# Patient Record
Sex: Female | Born: 1975 | Hispanic: Yes | Smoking: Former smoker
Health system: Southern US, Community
[De-identification: ages and names within clinical notes are randomized; demographics above are authoritative.]

## PROBLEM LIST (undated history)

## (undated) DIAGNOSIS — R011 Cardiac murmur, unspecified: Secondary | ICD-10-CM

## (undated) DIAGNOSIS — F4310 Post-traumatic stress disorder, unspecified: Secondary | ICD-10-CM

## (undated) DIAGNOSIS — F419 Anxiety disorder, unspecified: Secondary | ICD-10-CM

## (undated) DIAGNOSIS — F32A Depression, unspecified: Secondary | ICD-10-CM

## (undated) DIAGNOSIS — L409 Psoriasis, unspecified: Secondary | ICD-10-CM

## (undated) DIAGNOSIS — R7989 Other specified abnormal findings of blood chemistry: Secondary | ICD-10-CM

## (undated) HISTORY — PX: TUBAL LIGATION: SHX77

## (undated) HISTORY — PX: ENDOMETRIAL ABLATION: SHX621

---

## 2019-09-19 ENCOUNTER — Ambulatory Visit (HOSPITAL_COMMUNITY)
Admission: EM | Admit: 2019-09-19 | Discharge: 2019-09-19 | Disposition: A | Discharge status: Home / Self Care | Payer: Self-pay | Attending: Urgent Care | Admitting: Urgent Care

## 2019-09-19 ENCOUNTER — Other Ambulatory Visit: Payer: Self-pay

## 2019-09-19 ENCOUNTER — Encounter (HOSPITAL_COMMUNITY): Payer: Self-pay

## 2019-09-19 DIAGNOSIS — R0602 Shortness of breath: Secondary | ICD-10-CM

## 2019-09-19 DIAGNOSIS — R5381 Other malaise: Secondary | ICD-10-CM

## 2019-09-19 DIAGNOSIS — R0789 Other chest pain: Secondary | ICD-10-CM

## 2019-09-19 DIAGNOSIS — J029 Acute pharyngitis, unspecified: Secondary | ICD-10-CM

## 2019-09-19 DIAGNOSIS — B349 Viral infection, unspecified: Secondary | ICD-10-CM

## 2019-09-19 HISTORY — DX: Cardiac murmur, unspecified: R01.1

## 2019-09-19 HISTORY — DX: Psoriasis, unspecified: L40.9

## 2019-09-19 MED ORDER — CETIRIZINE HCL 10 MG PO TABS: 10.0000 mg | ORAL_TABLET | Freq: Every day | ORAL | 0 refills | Status: DC

## 2019-09-19 MED ORDER — PSEUDOEPHEDRINE HCL 60 MG PO TABS: 60.0000 mg | ORAL_TABLET | Freq: Three times a day (TID) | ORAL | 0 refills | Status: DC | PRN

## 2019-09-19 MED ORDER — NAPROXEN 500 MG PO TABS: 500.0000 mg | ORAL_TABLET | Freq: Two times a day (BID) | ORAL | 0 refills | Status: DC

## 2019-09-19 NOTE — Discharge Instructions (Addendum)

## 2019-09-19 NOTE — ED Triage Notes (Signed)
Patient presents to Urgent Care with complaints of sore throat since the past few days. Patient reports she took ibuprofen this morning at 0800, pt thinks her throat is sore from having to double-mask at work.

## 2019-09-19 NOTE — ED Provider Notes (Signed)
[facility]   MRN: [id number] DOB: 13-Jun-1976  Subjective:   Kelsey Lee is a 44 y.o. female presenting for 2 to 3-day history of acute onset malaise including persistent and worsening throat pain.  States that last night she had a transient episode of chest pain and shortness of breath.  This is no longer present today but is concerning to the patient.  She did take ibuprofen with some relief.  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 Denies direct COVID 19 contacts/exposure.  Denies smoking history, history of asthma or COPD, history of allergies.  She did have postnasal drainage last night.  Felt like symptoms were worse overnight.   Denies taking chronic medications.  No Known Allergies  Past Medical History:  Diagnosis Date  . Heart murmur   . Psoriasis      Past Surgical History:  Procedure Laterality Date  . CESAREAN SECTION    . ENDOMETRIAL ABLATION    . TUBAL LIGATION      Family History  Problem Relation Age of Onset  . Diabetes Mother   . Hypertension Mother   . Cancer Father   . Diabetes Father   . Hypertension Father     Social History   Tobacco Use  . Smoking status: Current Every Day Smoker    Types: Cigarettes  . Smokeless tobacco: Never Used  . Tobacco comment: 2 cigs per day  Substance Use Topics  . Alcohol use: Not Currently  . Drug use: Not on file    Review of Systems  Constitutional: Negative for fever and malaise/fatigue.  HENT: Positive for sore throat. Negative for congestion, ear pain and sinus pain.   Eyes: Negative for blurred vision, double vision, discharge and redness.  Respiratory: Positive for shortness of breath. Negative for cough, hemoptysis and wheezing.   Cardiovascular: Positive for chest pain.  Gastrointestinal: Negative for abdominal pain, diarrhea, nausea and vomiting.  Genitourinary: Negative for dysuria, flank pain and hematuria.  Musculoskeletal: Negative  for myalgias.  Skin: Negative for rash.  Neurological: Negative for dizziness, weakness and headaches.  Psychiatric/Behavioral: Negative for depression and substance abuse.     Objective:   Vitals: BP (!) 107/58 (BP Location: Left Arm)   Pulse 85   Temp 98.4 F (36.9 C) (Oral)   Resp 16   SpO2 99%   Physical Exam Constitutional:      General: She is not in acute distress.    Appearance: Normal appearance. She is well-developed. She is not ill-appearing, toxic-appearing or diaphoretic.  HENT:     Head: Normocephalic and atraumatic.     Right Ear: Tympanic membrane and ear canal normal. No drainage or tenderness. No middle ear effusion. Tympanic membrane is not erythematous.     Left Ear: Tympanic membrane and ear canal normal. No drainage or tenderness.  No middle ear effusion. Tympanic membrane is not erythematous.     Nose: Nose normal. No congestion or rhinorrhea.     Mouth/Throat:     Mouth: Mucous membranes are moist. No oral lesions.     Pharynx: No pharyngeal swelling, oropharyngeal exudate, posterior oropharyngeal erythema or uvula swelling.     Tonsils: No tonsillar exudate or tonsillar abscesses.     Comments: Post-nasal drainage overlying pharynx. Eyes:     Extraocular Movements: Extraocular movements intact.     Right eye: Normal extraocular motion.     Left eye: Normal extraocular motion.     Conjunctiva/sclera: Conjunctivae normal.  Pupils: Pupils are equal, round, and reactive to light.  Cardiovascular:     Rate and Rhythm: Normal rate and regular rhythm.     Pulses: Normal pulses.     Heart sounds: Normal heart sounds. No murmur. No friction rub. No gallop.   Pulmonary:     Effort: Pulmonary effort is normal. No respiratory distress.     Breath sounds: Normal breath sounds. No stridor. No wheezing, rhonchi or rales.  Musculoskeletal:     Cervical back: Normal range of motion and neck supple.  Lymphadenopathy:     Cervical: No cervical adenopathy.    Skin:    General: Skin is warm and dry.     Findings: No rash.  Neurological:     General: No focal deficit present.     Mental Status: She is alert and oriented to person, place, and time.  Psychiatric:        Mood and Affect: Mood normal.        Behavior: Behavior normal.        Thought Content: Thought content normal.      Assessment and Plan :   1. Viral illness   2. Sore throat   3. Malaise   4. Atypical chest pain   5. Shortness of breath     Patient refused COVID-19 testing, strep testing and testing in general due to cost burden.  Counseled that she is likely undergoing a viral illness and recommended supportive care.  Offered her chest x-ray, EKG to further evaluate her episode of chest pain and shortness of breath last night.  She declined this due to cost burden and lack of medical history for risk factors.  She will pursue COVID-19 testing to her employer. Counseled patient on potential for adverse effects with medications prescribed/recommended today, ER and return-to-clinic precautions discussed, patient verbalized understanding.    Jaynee Eagles, PA-C 09/19/19 1253

## 2020-04-04 ENCOUNTER — Encounter (HOSPITAL_COMMUNITY): Payer: Self-pay

## 2020-04-04 ENCOUNTER — Ambulatory Visit (HOSPITAL_COMMUNITY)
Admission: EM | Admit: 2020-04-04 | Discharge: 2020-04-04 | Disposition: A | Discharge status: Home / Self Care | Payer: 59 | Attending: Family Medicine | Admitting: Family Medicine

## 2020-04-04 ENCOUNTER — Other Ambulatory Visit: Payer: Self-pay

## 2020-04-04 DIAGNOSIS — I959 Hypotension, unspecified: Secondary | ICD-10-CM | POA: Insufficient documentation

## 2020-04-04 DIAGNOSIS — R0789 Other chest pain: Secondary | ICD-10-CM | POA: Insufficient documentation

## 2020-04-04 LAB — COMPREHENSIVE METABOLIC PANEL
ALT: 20 U/L (ref 0–44)
AST: 22 U/L (ref 15–41)
Albumin: 4 g/dL (ref 3.5–5.0)
Alkaline Phosphatase: 61 U/L (ref 38–126)
Anion gap: 9 (ref 5–15)
BUN: 8 mg/dL (ref 6–20)
CO2: 24 mmol/L (ref 22–32)
Calcium: 9.1 mg/dL (ref 8.9–10.3)
Chloride: 103 mmol/L (ref 98–111)
Creatinine, Ser: 0.53 mg/dL (ref 0.44–1.00)
GFR calc Af Amer: >60 mL/min (ref >60)
GFR calc non Af Amer: >60 mL/min (ref >60)
Glucose, Bld: 96 mg/dL (ref 70–99)
Potassium: 4.2 mmol/L (ref 3.5–5.1)
Sodium: 136 mmol/L (ref 135–145)
Total Bilirubin: 0.5 mg/dL (ref 0.3–1.2)
Total Protein: 7 g/dL (ref 6.5–8.1)

## 2020-04-04 LAB — CBC WITH DIFFERENTIAL/PLATELET
Abs Immature Granulocytes: 0.02 10*3/uL (ref 0.00–0.07)
Basophils Absolute: 0.1 10*3/uL (ref 0.0–0.1)
Basophils Relative: 1 %
Eosinophils Absolute: 0.1 10*3/uL (ref 0.0–0.5)
Eosinophils Relative: 2 %
HCT: 42.7 % (ref 36.0–46.0)
Hemoglobin: 14.4 g/dL (ref 12.0–15.0)
Immature Granulocytes: 0 %
Lymphocytes Relative: 29 %
Lymphs Abs: 2 10*3/uL (ref 0.7–4.0)
MCH: 30.6 pg (ref 26.0–34.0)
MCHC: 33.7 g/dL (ref 30.0–36.0)
MCV: 90.7 fL (ref 80.0–100.0)
Monocytes Absolute: 0.5 10*3/uL (ref 0.1–1.0)
Monocytes Relative: 7 %
Neutro Abs: 4.2 10*3/uL (ref 1.7–7.7)
Neutrophils Relative %: 61 %
Platelets: 251 10*3/uL (ref 150–400)
RBC: 4.71 MIL/uL (ref 3.87–5.11)
RDW: 13.6 % (ref 11.5–15.5)
WBC: 7 10*3/uL (ref 4.0–10.5)
nRBC: 0 % (ref 0.0–0.2)
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
## 2020-04-04 NOTE — Discharge Instructions (Addendum)
It was wonderful to meet you. It is very important that you call cardiology for further evaluation. Please make sure that you are drinking plenty of water and staying hydrated. Continue to do breathing exercises and work with therapy to help control your anxiety. You should hear back from your lab results in the next few days.  If you have a recurrence of chest pain, feeling lightheaded/dizzy, shortness of breath, or any passing out episode please be seen right away.

## 2020-04-04 NOTE — ED Triage Notes (Signed)
Pt c/o "low blood pressure" yesterday and today. Pt states yesterday her BP was 81/40s and she was experiencing "stabbing" CP to left sternal area and radiating to left back scapular/shoulder area. Pt also reports dizziness/lightheadedness and "sweaty/clammy palms" at time. Pt took low dose ASA at home yesterday and slept   Pt describes current CP as "dull like a bruise" 4/10. Reports fatigue.   Denies SOB, diaphoresis, extremity weakness, slurred speech, change in vision. Pt verbalizes "tender" when left chest area palpated.  EKG performed and results given to T. Bast and Dr. Delton See, who advised that pt can be triaged and return to waiting room for further eval according to time of arrival.

## 2020-05-29 ENCOUNTER — Telehealth: Payer: HRSA Program | Admitting: Nurse Practitioner

## 2020-05-29 DIAGNOSIS — U071 COVID-19: Secondary | ICD-10-CM | POA: Diagnosis not present

## 2020-05-29 MED ORDER — BENZONATATE 100 MG PO CAPS: 100.0000 mg | ORAL_CAPSULE | Freq: Three times a day (TID) | ORAL | 0 refills | Status: DC | PRN

## 2020-05-29 NOTE — Progress Notes (Signed)
E-Visit for Corona Virus Screening  Your current symptoms could be consistent with the coronavirus.  Many health care providers can now test patients at their office but not all are.  [facility] has multiple testing sites. For information on our COVID testing locations and hours go to [email or web address]  We are enrolling you in our MyChart Home Monitoring for COVID19 . Daily you will receive a questionnaire within the MyChart website. Our COVID 19 response team will be monitoring your responses daily.  Testing Information: The COVID-19 Community Testing sites are testing BY APPOINTMENT ONLY.  You can schedule online at [email or web address]  If you do not have access to a smart phone or computer you may call [phone] for an appointment.   Additional testing sites in the Community:  . For [facility] Testing sites in [place]  https://www.[facility]vs.com/minuteclinic/covid-19-testing  . For Pop[facility] testing sites in [place]  https://covid19.ncd[facility]hs.gov/about-covid-19/testing/find-my-testing-place/pop-testing-sites  . For [facility] https://[place].gov[place]/human-services/health-department/coronavirus-covid-19-info/covid-19-testing  . For [place] testing in [place] https://www.[place].gov[place]/human-services/health-department/coronavirus-covid-19-info/covid-19-testing  . For [facility] testing in [place]   https://[facility].care/covidtesting  For  more information about community testing call [phone]   Please quarantine yourself while awaiting your test results. Please stay home for a minimum of 10 days from the first day of illness with improving symptoms and you have had 24 hours of no fever (without the use of Tylenol (Acetaminophen) Motrin (Ibuprofen) or any fever reducing medication).  Also - Do not get tested prior to returning to work because once you have had a positive test the test can stay  positive for more than a month in some cases.   You should wear a mask or cloth face covering over your nose and mouth if you must be around other people or animals, including pets (even at home). Try to stay at least 6 feet away from other people. This will protect the people around you.  Please continue good preventive care measures, including:  frequent hand-washing, avoid touching your face, cover coughs/sneezes, stay out of crowds and keep a 6 foot distance from others.  COVID-19 is a respiratory illness with symptoms that are similar to the flu. Symptoms are typically mild to moderate, but there have been cases of severe illness and death due to the virus.   The following symptoms may appear 2-14 days after exposure: . Fever . Cough . Shortness of breath or difficulty breathing . Chills . Repeated shaking with chills . Muscle pain . Headache . Sore throat . New loss of taste or smell . Fatigue . Congestion or runny nose . Nausea or vomiting . Diarrhea  Go to the nearest hospital ED for assessment if fever/cough/breathlessness are severe or illness seems like a threat to life.  It is vitally important that if you feel that you have an infection such as this virus or any other virus that you stay home and away from places where you may spread it to others.  You should avoid contact with people age 65 and older.   You can use medication such as A prescription cough medication called Tessalon Perles 100 mg. You may take 1-2 capsules every 8 hours as needed for cough  You may also take acetaminophen (Tylenol) as needed for fever.  Reduce your risk of any infection by using the same precautions used for avoiding the common cold or flu:  . Wash your hands often with soap and warm water for at least 20 seconds.  If soap and water are not   readily available, use an alcohol-based hand sanitizer with at least 60% alcohol.  . If coughing or sneezing, cover your mouth and nose by coughing or  sneezing into the elbow areas of your shirt or coat, into a tissue or into your sleeve (not your hands). . Avoid shaking hands with others and consider head nods or verbal greetings only. . Avoid touching your eyes, nose, or mouth with unwashed hands.  . Avoid close contact with people who are sick. . Avoid places or events with large numbers of people in one location, like concerts or sporting events. . Carefully consider travel plans you have or are making. . If you are planning any travel outside or inside the [place], visit the CDC's Travelers' Health webpage for the latest health notices. . If you have some symptoms but not all symptoms, continue to monitor at home and seek medical attention if your symptoms worsen. . If you are having a medical emergency, call 911.  HOME CARE . Only take medications as instructed by your medical team. . Drink plenty of fluids and get plenty of rest. . A steam or ultrasonic humidifier can help if you have congestion.   GET HELP RIGHT AWAY IF YOU HAVE EMERGENCY WARNING SIGNS** FOR COVID-19. If you or someone is showing any of these signs seek emergency medical care immediately. Call 911 or proceed to your closest emergency facility if: . You develop worsening high fever. . Trouble breathing . Bluish lips or face . Persistent pain or pressure in the chest . New confusion . Inability to wake or stay awake . You cough up blood. . Your symptoms become more severe  **This list is not all possible symptoms. Contact your medical provider for any symptoms that are sever or concerning to you.  MAKE SURE YOU   Understand these instructions.  Will watch your condition.  Will get help right away if you are not doing well or get worse.  Your e-visit answers were reviewed by a board certified advanced clinical practitioner to complete your personal care plan.  Depending on the condition, your plan could have included both over the counter or prescription  medications.  If there is a problem please reply once you have received a response from your provider.  Your safety is important to us.  If you have drug allergies check your prescription carefully.    You can use MyChart to ask questions about today's visit, request a non-urgent call back, or ask for a work or school excuse for 24 hours related to this e-Visit. If it has been greater than 24 hours you will need to follow up with your provider, or enter a new e-Visit to address those concerns. You will get an e-mail in the next two days asking about your experience.  I hope that your e-visit has been valuable and will speed your recovery. Thank you for using e-visits.  5-10 minutes spent reviewing and documenting in chart.   

## 2020-06-27 DIAGNOSIS — Z1231 Encounter for screening mammogram for malignant neoplasm of breast: Secondary | ICD-10-CM

## 2020-07-25 ENCOUNTER — Ambulatory Visit: Payer: 59 | Admitting: Family Medicine

## 2020-08-19 ENCOUNTER — Telehealth: Payer: 59 | Admitting: Family

## 2020-08-19 DIAGNOSIS — R6889 Other general symptoms and signs: Secondary | ICD-10-CM

## 2020-08-19 MED ORDER — BENZONATATE 100 MG PO CAPS: 100.0000 mg | ORAL_CAPSULE | Freq: Three times a day (TID) | ORAL | 0 refills | Status: DC | PRN

## 2020-08-19 NOTE — Progress Notes (Signed)
E-Visit for Corona Virus Screening  Your current symptoms could be consistent with the coronavirus.  Many health care providers can now test patients at their office but not all are.  [facility] has multiple testing sites. For information on our COVID testing locations and hours go to [email or web address]  We are enrolling you in our MyChart Home Monitoring for COVID19 . Daily you will receive a questionnaire within the MyChart website. Our COVID 19 response team will be monitoring your responses daily.  Testing Information: The COVID-19 Community Testing sites are testing BY APPOINTMENT ONLY.  You can schedule online at [email or web address]  If you do not have access to a smart phone or computer you may call [phone] for an appointment.   Additional testing sites in the Community:  . For [facility] Testing sites in [facility]  [email or web address]  . For Pop-up testing sites in [place]  [email or web address]  . For [facility] [email or web address]  . For [facility] testing in [place] and [employer] [email or web address]  . For [facility] testing in [facility]   [email or web address]  For  more information about community testing call [phone]   Please quarantine yourself while awaiting your test results. Please stay home for a minimum of 10 days from the first day of illness with improving symptoms and you have had 24 hours of no fever (without the use of Tylenol (Acetaminophen) Motrin (Ibuprofen) or any fever reducing medication).  Also - Do not get tested prior to returning to work because once you have had a positive test the test can stay  positive for more than a month in some cases.   You should wear a mask or cloth face covering over your nose and mouth if you must be around other people or animals, including pets (even at home). Try to stay at least 6 feet away from other people. This will protect the people around you.  Please continue good preventive care measures, including:  frequent hand-washing, avoid touching your face, cover coughs/sneezes, stay out of crowds and keep a 6 foot distance from others.  COVID-19 is a respiratory illness with symptoms that are similar to the flu. Symptoms are typically mild to moderate, but there have been cases of severe illness and death due to the virus.   The following symptoms may appear 2-14 days after exposure: . Fever . Cough . Shortness of breath or difficulty breathing . Chills . Repeated shaking with chills . Muscle pain . Headache . Sore throat . New loss of taste or smell . Fatigue . Congestion or runny nose . Nausea or vomiting . Diarrhea  Go to the nearest hospital ED for assessment if fever/cough/breathlessness are severe or illness seems like a threat to life.  It is vitally important that if you feel that you have an infection such as this virus or any other virus that you stay home and away from places where you may spread it to others.  You should avoid contact with people age 4 and older.   You can use medication such as A prescription cough medication called Tessalon Perles 100 mg. You may take 1-2 capsules every 8 hours as needed for cough.   I have sent a work note to [employer], It will be under letters.   You may also take acetaminophen (Tylenol) as needed for fever.  Reduce your risk of any infection by using the same precautions used for avoiding the common cold or flu:  Marland Kitchen Wash your hands often  with soap and warm water for at least 20 seconds.  If soap and water are not readily available, use an alcohol-based hand sanitizer with at least 60% alcohol.   . If coughing or sneezing, cover your mouth and nose by coughing or sneezing into the elbow areas of your shirt or coat, into a tissue or into your sleeve (not your hands). . Avoid shaking hands with others and consider head nods or verbal greetings only. . Avoid touching your eyes, nose, or mouth with unwashed hands.  . Avoid close contact with people who are sick. . Avoid places or events with large numbers of people in one location, like concerts or sporting events. . Carefully consider travel plans you have or are making. . If you are planning any travel outside or inside the [place], visit the CDC's Travelers' Health webpage for the latest health notices. . If you have some symptoms but not all symptoms, continue to monitor at home and seek medical attention if your symptoms worsen. . If you are having a medical emergency, call 911.  HOME CARE . Only take medications as instructed by your medical team. . Drink plenty of fluids and get plenty of rest. . A steam or ultrasonic humidifier can help if you have congestion.   GET HELP RIGHT AWAY IF YOU HAVE EMERGENCY WARNING SIGNS** FOR COVID-19. If you or someone is showing any of these signs seek emergency medical care immediately. Call 911 or proceed to your closest emergency facility if: . You develop worsening high fever. . Trouble breathing . Bluish lips or face . Persistent pain or pressure in the chest . New confusion . Inability to wake or stay awake . You cough up blood. . Your symptoms become more severe  **This list is not all possible symptoms. Contact your medical provider for any symptoms that are sever or concerning to you.  MAKE SURE YOU   Understand these instructions.  Will watch your condition.  Will get help right away if you are not doing well or get worse.  Your e-visit answers were reviewed by a board certified advanced clinical practitioner to complete your personal care plan.  Depending on the condition, your  plan could have included both over the counter or prescription medications.  If there is a problem please reply once you have received a response from your provider.  Your safety is important to [place].  If you have drug allergies check your prescription carefully.    You can use MyChart to ask questions about today's visit, request a non-urgent call back, or ask for a work or school excuse for 24 hours related to this e-Visit. If it has been greater than 24 hours you will need to follow up with your provider, or enter a new e-Visit to address those concerns. You will get an e-mail in the next two days asking about your experience.  I hope that your e-visit has been valuable and will speed your recovery. Thank you for using e-visits.  Approximately 5 minutes was spent documenting and reviewing patient's chart.

## 2020-10-16 ENCOUNTER — Telehealth: Payer: 59 | Admitting: Nurse Practitioner

## 2020-10-16 DIAGNOSIS — K591 Functional diarrhea: Secondary | ICD-10-CM

## 2020-10-16 NOTE — Progress Notes (Signed)
We are sorry that you are not feeling well.  Here is how we plan to help!  Based on what you have shared with me it looks like you have Acute Infectious Diarrhea.  Most cases of acute diarrhea are due to infections with virus and bacteria and are self-limited conditions lasting less than 14 days.  For your symptoms you may take Imodium 2 mg tablets that are over the counter at your local pharmacy. Take two tablet now and then one after each loose stool up to 6 a day.  Antibiotics are not needed for most people with diarrhea.   HOME CARE  We recommend changing your diet to help with your symptoms for the next few days.  Drink plenty of fluids that contain water salt and sugar. Sports drinks such as Gatorade may help.   You may try broths, soups, bananas, applesauce, soft breads, mashed potatoes or crackers.   You are considered infectious for as long as the diarrhea continues. Hand washing or use of alcohol based hand sanitizers is recommend.  It is best to stay out of work or school until your symptoms stop.   GET HELP RIGHT AWAY  If you have dark yellow colored urine or do not pass urine frequently you should drink more fluids.    If your symptoms worsen   If you feel like you are going to pass out (faint)  You have a new problem  MAKE SURE YOU   Understand these instructions.  Will watch your condition.  Will get help right away if you are not doing well or get worse.  Your e-visit answers were reviewed by a board certified advanced clinical practitioner to complete your personal care plan.  Depending on the condition, your plan could have included both over the counter or prescription medications.  If there is a problem please reply  once you have received a response from your provider.  Your safety is important to us.  If you have drug allergies check your prescription carefully.    You can use MyChart to ask questions about today's visit, request a non-urgent call  back, or ask for a work or school excuse for 24 hours related to this e-Visit. If it has been greater than 24 hours you will need to follow up with your provider, or enter a new e-Visit to address those concerns.   You will get an e-mail in the next two days asking about your experience.  I hope that your e-visit has been valuable and will speed your recovery. Thank you for using e-visits.  5-10 minutes spent reviewing and documenting in chart.  

## 2021-07-27 ENCOUNTER — Encounter (HOSPITAL_BASED_OUTPATIENT_CLINIC_OR_DEPARTMENT_OTHER): Payer: Self-pay

## 2021-07-27 ENCOUNTER — Emergency Department (HOSPITAL_BASED_OUTPATIENT_CLINIC_OR_DEPARTMENT_OTHER)
Admission: EM | Admit: 2021-07-27 | Discharge: 2021-07-28 | Disposition: A | Discharge status: Home / Self Care | Payer: Medicaid Other | Attending: Emergency Medicine | Admitting: Emergency Medicine

## 2021-07-27 ENCOUNTER — Other Ambulatory Visit: Payer: Self-pay

## 2021-07-27 ENCOUNTER — Emergency Department (HOSPITAL_BASED_OUTPATIENT_CLINIC_OR_DEPARTMENT_OTHER): Payer: Medicaid Other | Admitting: Radiology

## 2021-07-27 DIAGNOSIS — R0602 Shortness of breath: Secondary | ICD-10-CM | POA: Diagnosis not present

## 2021-07-27 DIAGNOSIS — F17210 Nicotine dependence, cigarettes, uncomplicated: Secondary | ICD-10-CM | POA: Insufficient documentation

## 2021-07-27 DIAGNOSIS — R0789 Other chest pain: Secondary | ICD-10-CM | POA: Insufficient documentation

## 2021-07-27 DIAGNOSIS — R42 Dizziness and giddiness: Secondary | ICD-10-CM | POA: Diagnosis not present

## 2021-07-27 HISTORY — DX: Other specified abnormal findings of blood chemistry: R79.89

## 2021-07-27 HISTORY — DX: Anxiety disorder, unspecified: F41.9

## 2021-07-27 HISTORY — DX: Depression, unspecified: F32.A

## 2021-07-27 HISTORY — DX: Post-traumatic stress disorder, unspecified: F43.10

## 2021-07-27 LAB — BASIC METABOLIC PANEL
Anion gap: 8 (ref 5–15)
BUN: 8 mg/dL (ref 6–20)
CO2: 26 mmol/L (ref 22–32)
Calcium: 9.3 mg/dL (ref 8.9–10.3)
Chloride: 105 mmol/L (ref 98–111)
Creatinine, Ser: 0.6 mg/dL (ref 0.44–1.00)
GFR, Estimated: >60 mL/min (ref >60)
Glucose, Bld: 98 mg/dL (ref 70–99)
Potassium: 3.8 mmol/L (ref 3.5–5.1)
Sodium: 139 mmol/L (ref 135–145)

## 2021-07-27 LAB — CBC WITH DIFFERENTIAL/PLATELET
Abs Immature Granulocytes: 0.02 10*3/uL (ref 0.00–0.07)
Basophils Absolute: 0 10*3/uL (ref 0.0–0.1)
Basophils Relative: 0 %
Eosinophils Absolute: 0.2 10*3/uL (ref 0.0–0.5)
Eosinophils Relative: 2 %
HCT: 42.1 % (ref 36.0–46.0)
Hemoglobin: 14.3 g/dL (ref 12.0–15.0)
Immature Granulocytes: 0 %
Lymphocytes Relative: 31 %
Lymphs Abs: 3.1 10*3/uL (ref 0.7–4.0)
MCH: 29.5 pg (ref 26.0–34.0)
MCHC: 34 g/dL (ref 30.0–36.0)
MCV: 86.8 fL (ref 80.0–100.0)
Monocytes Absolute: 0.7 10*3/uL (ref 0.1–1.0)
Monocytes Relative: 7 %
Neutro Abs: 5.9 10*3/uL (ref 1.7–7.7)
Neutrophils Relative %: 60 %
Platelets: 297 10*3/uL (ref 150–400)
RBC: 4.85 MIL/uL (ref 3.87–5.11)
RDW: 13.9 % (ref 11.5–15.5)
WBC: 10 10*3/uL (ref 4.0–10.5)
nRBC: 0 % (ref 0.0–0.2)

## 2021-07-27 LAB — TROPONIN I (HIGH SENSITIVITY): Troponin I (High Sensitivity): 3 ng/L (ref <18)

## 2021-07-27 MED ORDER — KETOROLAC TROMETHAMINE 30 MG/ML IJ SOLN
30.0000 mg | Freq: Once | INTRAMUSCULAR | Status: AC
  Administered 2021-07-27: 21:00:00 30 mg via INTRAVENOUS
  Filled 2021-07-27: qty 1

## 2021-07-27 NOTE — ED Triage Notes (Signed)
Pt c/o centralized chest pain that gradually started to develop this AM. Now pt describes the pain as sharp and severe. Pain is worsened by any movement. Pain radiates to L shoulder. Pt states she has associated ShOB, and lightheadedness.

## 2021-07-27 NOTE — ED Provider Notes (Signed)
[facility] EMERGENCY DEPARTMENT Provider Note  CSN: [id number] Arrival date & time: 07/27/21 1951    History Chief Complaint  Patient presents with   Chest Pain    Kelsey Lee is a 45 y.o. female with no significant medical history reports onset of mild sharp midsternal chest pain this AM while doing things around the house, no strenuous activity. Pain was persistent and gradually worsening through the day. Pain is worse with movement and deep breath, radiates into L shoulder. Some SOB and feeling dizzy. No nausea or diaphoresis. No prior history of ACS or CAD. Denies HTN, DM or HLD. Had a family member with stents in their 66s    Past Medical History:  Diagnosis Date   Anxiety    Depression    Heart murmur    Low vitamin D level    Psoriasis    PTSD (post-traumatic stress disorder)     Past Surgical History:  Procedure Laterality Date   CESAREAN SECTION     ENDOMETRIAL ABLATION     TUBAL LIGATION      Family History  Problem Relation Age of Onset   Diabetes Mother    Hypertension Mother    Cancer Father    Diabetes Father    Hypertension Father     Social History   Tobacco Use   Smoking status: Every Day    Types: Cigarettes   Smokeless tobacco: Never   Tobacco comments:    2 cigs per day  Vaping Use   Vaping Use: Never used  Substance Use Topics   Alcohol use: Not Currently   Drug use: Yes    Types: Marijuana    Comment: occasionally     Home Medications Prior to Admission medications   Medication Sig Start Date End Date Taking? Authorizing Provider  albuterol (PROVENTIL) (2.5 MG/3ML) 0.083% nebulizer solution albuterol sulfate 2.5 mg/3 mL (0.083 %) solution for nebulization  VVN TID    [provider]  benzonatate (TESSALON PERLES) 100 MG capsule Take 1 capsule (100 mg total) by mouth 3 (three) times daily as needed. 08/19/20   Junie Spencer, FNP  cetirizine (ZYRTEC ALLERGY) 10 MG tablet Take  1 tablet (10 mg total) by mouth daily. 09/19/19   Wallis Bamberg, PA-C  clonazePAM (KLONOPIN) 1 MG tablet clonazepam 1 mg tablet  TAKE 1 TABLET BY MOUTH ONCE DAILY AS NEEDED FOR ANXIETY    [provider]  FLUoxetine (PROZAC) 20 MG capsule Take 20 mg by mouth every morning. 02/28/20   [provider]  naproxen (NAPROSYN) 500 MG tablet Take 1 tablet (500 mg total) by mouth 2 (two) times daily. 09/19/19   Wallis Bamberg, PA-C  prazosin (MINIPRESS) 1 MG capsule Take 2 mg by mouth at bedtime. 02/28/20   [provider]  pseudoephedrine (SUDAFED) 60 MG tablet Take 1 tablet (60 mg total) by mouth every 8 (eight) hours as needed for congestion. 09/19/19   Wallis Bamberg, PA-C     Allergies    Patient has no known allergies.   Review of Systems   Review of Systems A comprehensive review of systems was completed and negative except as noted in HPI.    Physical Exam BP (!) 110/54    Pulse 80    Temp 97.8 F (36.6 C) (Oral)    Resp 18    Ht 5\' 1"  (1.549 m)    Wt 77.1 kg    LMP 06/30/2021    SpO2 97%    BMI 32.12  kg/m   Physical Exam Vitals and nursing note reviewed.  Constitutional:      Appearance: Normal appearance.  HENT:     Head: Normocephalic and atraumatic.     Nose: Nose normal.     Mouth/Throat:     Mouth: Mucous membranes are moist.  Eyes:     Extraocular Movements: Extraocular movements intact.     Conjunctiva/sclera: Conjunctivae normal.  Cardiovascular:     Rate and Rhythm: Normal rate.  Pulmonary:     Effort: Pulmonary effort is normal.     Breath sounds: Normal breath sounds.  Chest:     Chest wall: Tenderness (bilateral parasternal) present.  Abdominal:     General: Abdomen is flat.     Palpations: Abdomen is soft.     Tenderness: There is no abdominal tenderness.  Musculoskeletal:        General: No swelling. Normal range of motion.     Cervical back: Neck supple.  Skin:    General: Skin is warm and dry.  Neurological:     General: No focal deficit  present.     Mental Status: She is alert.  Psychiatric:        Mood and Affect: Mood normal.     ED Results / Procedures / Treatments   Labs (all labs ordered are listed, but only abnormal results are displayed) Labs Reviewed - No data to display  EKG EKG Interpretation  Date/Time:  Sunday July 27 2021 19:57:50 EST Ventricular Rate:  82 PR Interval:  140 QRS Duration: 78 QT Interval:  372 QTC Calculation: 434 R Axis:   45 Text Interpretation: Normal sinus rhythm Cannot rule out Anterior infarct , age undetermined Abnormal ECG No significant change since last tracing Confirmed by Susy Frizzle [phone]) on 07/27/2021 7:59:54 PM   Radiology No results found.  Procedures Procedures  Medications Ordered in the ED Medications  ketorolac (TORADOL) 30 MG/ML injection 30 mg (has no administration in time range)     MDM Rules/Calculators/A&P MDM Patient atypical pleuritic chest pains. No tachycardia or hypoxia to suggest PE. Will check labs, CXR, Toradol for pain.   ED Course  I have reviewed the triage vital signs and the nursing notes.  Pertinent labs & imaging results that were available during my care of the patient were reviewed by me and considered in my medical decision making (see chart for details).  Clinical Course as of 07/28/21 1557  Sun Jul 27, 2021  2059 CBC is normal.  [CS]  2123 CMP and first Trop is neg.  [CS]  2206 CXR is clear. Awaiting delta trop [CS]  2255 Patient reports pain is improved, second Trop is pending now.  [CS]  2324 Care of the patient signed out to Dr. Cristy Folks at the change of shift.  [CS]    Clinical Course User Index [CS] Pollyann Savoy, MD    Final Clinical Impression(s) / ED Diagnoses Final diagnoses:  None    Rx / DC Orders ED Discharge Orders     None        Pollyann Savoy, MD 07/28/21 1557

## 2021-07-28 LAB — TROPONIN I (HIGH SENSITIVITY): Troponin I (High Sensitivity): <2 ng/L (ref <18)

## 2021-07-28 NOTE — ED Notes (Signed)
Pt verbalizes understanding of discharge instructions. Opportunity for questioning and answers were provided. Pt discharged from ED to home.   ? ?

## 2021-07-28 NOTE — Discharge Instructions (Addendum)
You were seen today for chest pain.  Your work-up is reassuring.  Follow-up with cardiology as an outpatient.

## 2021-08-21 ENCOUNTER — Ambulatory Visit: Payer: 59 | Admitting: Cardiology

## 2021-10-15 ENCOUNTER — Telehealth: Payer: 59 | Admitting: Physician Assistant

## 2021-10-15 DIAGNOSIS — J069 Acute upper respiratory infection, unspecified: Secondary | ICD-10-CM

## 2021-10-15 MED ORDER — BENZONATATE 100 MG PO CAPS: 100.0000 mg | ORAL_CAPSULE | Freq: Three times a day (TID) | ORAL | 0 refills | Status: DC

## 2021-10-15 MED ORDER — PHENOL 1.4 % MT LIQD: 1.0000 | OROMUCOSAL | 0 refills | Status: DC | PRN

## 2021-10-15 NOTE — Progress Notes (Signed)

## 2021-10-17 ENCOUNTER — Ambulatory Visit (INDEPENDENT_AMBULATORY_CARE_PROVIDER_SITE_OTHER): Payer: Self-pay | Admitting: Cardiology

## 2021-10-17 ENCOUNTER — Other Ambulatory Visit: Payer: Self-pay

## 2021-10-17 ENCOUNTER — Encounter: Payer: Self-pay | Admitting: Cardiology

## 2021-10-17 ENCOUNTER — Ambulatory Visit (INDEPENDENT_AMBULATORY_CARE_PROVIDER_SITE_OTHER): Payer: Self-pay

## 2021-10-17 VITALS — BP 119/84 | HR 82 | Ht 61.0 in | Wt 168.6 lb

## 2021-10-17 DIAGNOSIS — Z79899 Other long term (current) drug therapy: Secondary | ICD-10-CM

## 2021-10-17 DIAGNOSIS — R079 Chest pain, unspecified: Secondary | ICD-10-CM | POA: Diagnosis not present

## 2021-10-17 DIAGNOSIS — E669 Obesity, unspecified: Secondary | ICD-10-CM | POA: Diagnosis not present

## 2021-10-17 DIAGNOSIS — R002 Palpitations: Secondary | ICD-10-CM

## 2021-10-17 NOTE — Progress Notes (Unsigned)
Enrolled for [employer] to mail a ZIO XT long term holter monitor to the patients address on file.  

## 2021-10-17 NOTE — Patient Instructions (Addendum)
Medication Instructions:  ?Your physician recommends that you continue on your current medications as directed. Please refer to the Current Medication list given to you today.  ?*If you need a refill on your cardiac medications before your next appointment, please call your pharmacy* ? ? ?Lab Work: ?Your physician recommends that you return for lab work in:  ?TODAY: BMET, Mag, TSH, ESR, CRP ?If you have labs (blood work) drawn today and your tests are completely normal, you will receive your results only by: ?MyChart Message (if you have MyChart) OR ?A paper copy in the mail ?If you have any lab test that is abnormal or we need to change your treatment, we will call you to review the results. ? ? ?Testing/Procedures: ?Your physician has requested that you have an echocardiogram. Echocardiography is a painless test that uses sound waves to create images of your heart. It provides your doctor with information about the size and shape of your heart and how well your heart?s chambers and valves are working. This procedure takes approximately one hour. There are no restrictions for this procedure. ? ?ZIO XT- Long Term Monitor Instructions ? ?Your physician has requested you wear a ZIO patch monitor for 14 days.  ?This is a single patch monitor. [employer] supplies one patch monitor per enrollment. Additional ?stickers are not available. Please do not apply patch if you will be having a Nuclear Stress Test,  ?Echocardiogram, Cardiac CT, MRI, or Chest Xray during the period you would be wearing the  ?monitor. The patch cannot be worn during these tests. You cannot remove and re-apply the  ?ZIO XT patch monitor.  ?Your ZIO patch monitor will be mailed 3 day USPS to your address on file. It may take 3-5 days  ?to receive your monitor after you have been enrolled.  ?Once you have received your monitor, please review the enclosed instructions. Your monitor  ?has already been registered assigning a specific monitor serial # to  you. ? ?Billing and Patient Assistance Program Information ? ?We have supplied [employer] with any of your insurance information on file for billing purposes. ?[employer] offers a sliding scale Patient Assistance Program for patients that do not have  ?insurance, or whose insurance does not completely cover the cost of the ZIO monitor.  ?You must apply for the Patient Assistance Program to qualify for this discounted rate.  ?To apply, please call [employer] at [phone], select option 4, select option 2, ask to apply for  ?Patient Assistance Program. Theodore Demark will ask your household income, and how many people  ?are in your household. They will quote your out-of-pocket cost based on that information.  ?[employer] will also be able to set up a 23-month interest-free payment plan if needed. ? ?Applying the monitor ?  ?Shave hair from upper left chest.  ?Hold abrader disc by orange tab. Rub abrader in 40 strokes over the upper left chest as  ?indicated in your monitor instructions.  ?Clean area with 4 enclosed alcohol pads. Let dry.  ?Apply patch as indicated in monitor instructions. Patch will be placed under collarbone on left  ?side of chest with arrow pointing upward.  ?Rub patch adhesive wings for 2 minutes. Remove white label marked "1". Remove the white  ?label marked "2". Rub patch adhesive wings for 2 additional minutes.  ?While looking in a mirror, press and release button in center of patch. A small green light will  ?flash 3-4 times. This will be your only indicator that the monitor has been turned  on.  ?Do not shower for the first 24 hours. You may shower after the first 24 hours.  ?Press the button if you feel a symptom. You will hear a small click. Record Date, Time and  ?Symptom in the Patient Logbook.  ?When you are ready to remove the patch, follow instructions on the last 2 pages of Patient  ?Logbook. Stick patch monitor onto the last page of Patient Logbook.  ?Place Patient Logbook in the blue and white box.  Use locking tab on box and tape box closed  ?securely. The blue and white box has prepaid postage on it. Please place it in the mailbox as  ?soon as possible. Your physician should have your test results approximately 7 days after the  ?monitor has been mailed back to [facility].  ?Call [facility] at [phone] if you have questions regarding  ?your ZIO XT patch monitor. Call them immediately if you see an orange light blinking on your  ?monitor.  ?If your monitor falls off in less than 4 days, contact our Monitor department at [phone].  ?If your monitor becomes loose or falls off after 4 days call [employer] at [phone] for  ?suggestions on securing your monitor ? ? ? ?Follow-Up: ?At [facility], you and your health needs are our priority.  As part of our continuing mission to provide you with exceptional heart care, we have created designated Provider Care Teams.  These Care Teams include your primary Cardiologist (physician) and Advanced Practice Providers (APPs -  Physician Assistants and Nurse Practitioners) who all work together to provide you with the care you need, when you need it. ? ?We recommend signing up for the patient portal called "MyChart".  Sign up information is provided on this After Visit Summary.  MyChart is used to connect with patients for Virtual Visits (Telemedicine).  Patients are able to view lab/test results, encounter notes, upcoming appointments, etc.  Non-urgent messages can be sent to your provider as well.   ?To learn more about what you can do with MyChart, go to [email or web address].   ? ?Your next appointment:   ?4 month(s) ? ?The format for your next appointment:   ?In Person ? ?Provider:   ?Berniece Salines, DO   ? ? ?Other Instructions ?  ?

## 2021-10-17 NOTE — Progress Notes (Addendum)
Cardiology Office Note:    Date:  10/18/2021   ID:  Kelsey Lee, DOB 23-Jul-1976, MRN [id number]  PCP:  Patient, No Pcp Per (Inactive)  Cardiologist:  Thomasene Ripple, DO  Electrophysiologist:  None   Referring MD: Shon Baton, MD   " I am having intermittent sharp chest pain "  History of Present Illness:    Kelsey Lee is a 46 y.o. female with a hx of traumatic stress disorder, depression, anxiety here today to be evaluated for chest pain.  The patient tells me that this has been going on for many years now.  She lives in [place] back in 2018 she has had episodes where she had multiple chest discomfort.  She states she had a stress test they were concerned about the stress test they gave her nitroglycerin after she had a stress test.  She then again had episodes of chest pain which led to a heart cath at that time which did not show any evidence of coronary artery disease.  She was taken off nitroglycerin.  She states that recently she started experiencing same pain.  She describes it as upper left sharp pain which sometimes feels stabbing.  Sometimes hurts when she breathes.  Nothing makes it better or worse.  It is intermittent.  She tells me she has noticed as well that she will have some palpitations when this happened.  She is concerned about this.  Past Medical History:  Diagnosis Date   Anxiety    Depression    Heart murmur    Low vitamin D level    Psoriasis    PTSD (post-traumatic stress disorder)     Past Surgical History:  Procedure Laterality Date   CESAREAN SECTION     ENDOMETRIAL ABLATION     TUBAL LIGATION      Current Medications: Current Meds  Medication Sig   FLUoxetine (PROZAC) 20 MG capsule Take 20 mg by mouth every morning.   hydrOXYzine (ATARAX) 25 MG tablet Take 25 mg by mouth as needed.   prazosin (MINIPRESS) 1 MG capsule Take 2 mg by mouth at bedtime.     Allergies:   Patient has no known allergies.   Social History    Socioeconomic History   Marital status: Media planner    Spouse name: Not on file   Number of children: Not on file   Years of education: Not on file   Highest education level: Not on file  Occupational History   Not on file  Tobacco Use   Smoking status: Every Day    Types: Cigarettes   Smokeless tobacco: Never   Tobacco comments:    2 cigs per day  Vaping Use   Vaping Use: Never used  Substance and Sexual Activity   Alcohol use: Not Currently   Drug use: Yes    Types: Marijuana    Comment: occasionally   Sexual activity: Not on file  Other Topics Concern   Not on file  Social History Narrative   Not on file   Social Determinants of Health   Financial Resource Strain: Not on file  Food Insecurity: Not on file  Transportation Needs: Not on file  Physical Activity: Not on file  Stress: Not on file  Social Connections: Not on file     Family History: The patient's family history includes Cancer in her father; Diabetes in her father and mother; Hypertension in her father and mother.  ROS:   Review of Systems  Constitution:  Negative for decreased appetite, fever and weight gain.  HENT: Negative for congestion, ear discharge, hoarse voice and sore throat.   Eyes: Negative for discharge, redness, vision loss in right eye and visual halos.  Cardiovascular: Reports chest pain and palpitations.  Negative for dyspnea on exertion, leg swelling, orthopnea Respiratory: Negative for cough, hemoptysis, shortness of breath and snoring.   Endocrine: Negative for heat intolerance and polyphagia.  Hematologic/Lymphatic: Negative for bleeding problem. Does not bruise/bleed easily.  Skin: Negative for flushing, nail changes, rash and suspicious lesions.  Musculoskeletal: Negative for arthritis, joint pain, muscle cramps, myalgias, neck pain and stiffness.  Gastrointestinal: Negative for abdominal pain, bowel incontinence, diarrhea and excessive appetite.  Genitourinary: Negative  for decreased libido, genital sores and incomplete emptying.  Neurological: Negative for brief paralysis, focal weakness, headaches and loss of balance.  Psychiatric/Behavioral: Negative for altered mental status, depression and suicidal ideas.  Allergic/Immunologic: Negative for HIV exposure and persistent infections.    EKGs/Labs/Other Studies Reviewed:    The following studies were reviewed today:   EKG:  The ekg ordered today demonstrates sinus rhythm, heart rate 84 bpm  Recent Labs: 07/27/2021: Hemoglobin 14.3; Platelets 297 10/17/2021: BUN 10; Creatinine, Ser 0.53; Magnesium 2.2; Potassium 4.6; Sodium 139; TSH 1.550  Recent Lipid Panel No results found for: CHOL, TRIG, HDL, CHOLHDL, VLDL, LDLCALC, LDLDIRECT  Physical Exam:    VS:  BP 119/84   Pulse 82   Ht 5\' 1"  (1.549 m)   Wt 168 lb 9.6 oz (76.5 kg)   SpO2 95%   BMI 31.86 kg/m     Wt Readings from Last 3 Encounters:  10/17/21 168 lb 9.6 oz (76.5 kg)  07/27/21 170 lb (77.1 kg)     GEN: Well nourished, well developed in no acute distress HEENT: Normal NECK: No JVD; No carotid bruits LYMPHATICS: No lymphadenopathy CARDIAC: S1S2 noted,RRR, no murmurs, rubs, gallops RESPIRATORY:  Clear to auscultation without rales, wheezing or rhonchi  ABDOMEN: Soft, non-tender, non-distended, +bowel sounds, no guarding. EXTREMITIES: No edema, No cyanosis, no clubbing MUSCULOSKELETAL:  No deformity  SKIN: Warm and dry NEUROLOGIC:  Alert and oriented x 3, non-focal PSYCHIATRIC:  Normal affect, good insight  ASSESSMENT:    1. Medication management   2. Chest pain of uncertain etiology   3. Palpitations   4. Obesity (BMI 30-39.9)    PLAN:     Her chest pain could be musculoskeletal but I like to rule out a component of pericarditis although her story does not 100% support pericarditis.  We will get ESR and CRP if elevated will treat the patient for pericarditis.  Also get an echocardiogram to rule out any pericardial  fusion.  In terms of her palpitations we will place a ZIO monitor on the patient for 14 days, get thyroid function test as well as BMP and mag.  The patient understands the need to lose weight with diet and exercise. We have discussed specific strategies for this.  The patient is in agreement with the above plan. The patient left the office in stable condition.  The patient will follow up in   Medication Adjustments/Labs and Tests Ordered: Current medicines are reviewed at length with the patient today.  Concerns regarding medicines are outlined above.  Orders Placed This Encounter  Procedures   Basic Metabolic Panel (BMET)   Magnesium   TSH+T4F+T3Free   Sed Rate (ESR)   C-reactive protein   LONG TERM MONITOR (3-14 DAYS)   EKG 12-Lead   ECHOCARDIOGRAM COMPLETE   No  orders of the defined types were placed in this encounter.   Patient Instructions  Medication Instructions:  Your physician recommends that you continue on your current medications as directed. Please refer to the Current Medication list given to you today.  *If you need a refill on your cardiac medications before your next appointment, please call your pharmacy*   Lab Work: Your physician recommends that you return for lab work in:  TODAY: BMET, Mag, TSH, ESR, CRP If you have labs (blood work) drawn today and your tests are completely normal, you will receive your results only by: MyChart Message (if you have MyChart) OR A paper copy in the mail If you have any lab test that is abnormal or we need to change your treatment, we will call you to review the results.   Testing/Procedures: Your physician has requested that you have an echocardiogram. Echocardiography is a painless test that uses sound waves to create images of your heart. It provides your doctor with information about the size and shape of your heart and how well your hearts chambers and valves are working. This procedure takes approximately one hour.  There are no restrictions for this procedure.  ZIO XT- Long Term Monitor Instructions  Your physician has requested you wear a ZIO patch monitor for 14 days.  This is a single patch monitor. [employer] supplies one patch monitor per enrollment. Additional stickers are not available. Please do not apply patch if you will be having a Nuclear Stress Test,  Echocardiogram, Cardiac CT, MRI, or Chest Xray during the period you would be wearing the  monitor. The patch cannot be worn during these tests. You cannot remove and re-apply the  ZIO XT patch monitor.  Your ZIO patch monitor will be mailed 3 day USPS to your address on file. It may take 3-5 days  to receive your monitor after you have been enrolled.  Once you have received your monitor, please review the enclosed instructions. Your monitor  has already been registered assigning a specific monitor serial # to you.  Billing and Patient Assistance Program Information  We have supplied [employer] with any of your insurance information on file for billing purposes. [employer] offers a sliding scale Patient Assistance Program for patients that do not have  insurance, or whose insurance does not completely cover the cost of the ZIO monitor.  You must apply for the Patient Assistance Program to qualify for this discounted rate.  To apply, please call [employer] at [phone], select option 4, select option 2, ask to apply for  Patient Assistance Program. Meredeth Ide will ask your household income, and how many people  are in your household. They will quote your out-of-pocket cost based on that information.  [employer] will also be able to set up a 12-month, interest-free payment plan if needed.  Applying the monitor   Shave hair from upper left chest.  Hold abrader disc by orange tab. Rub abrader in 40 strokes over the upper left chest as  indicated in your monitor instructions.  Clean area with 4 enclosed alcohol pads. Let dry.  Apply patch as indicated in  monitor instructions. Patch will be placed under collarbone on left  side of chest with arrow pointing upward.  Rub patch adhesive wings for 2 minutes. Remove white label marked "1". Remove the white  label marked "2". Rub patch adhesive wings for 2 additional minutes.  While looking in a mirror, press and release button in center of patch. A small green light will  flash 3-4 times. This will be your only indicator that the monitor has been turned on.  Do not shower for the first 24 hours. You may shower after the first 24 hours.  Press the button if you feel a symptom. You will hear a small click. Record Date, Time and  Symptom in the Patient Logbook.  When you are ready to remove the patch, follow instructions on the last 2 pages of Patient  Logbook. Stick patch monitor onto the last page of Patient Logbook.  Place Patient Logbook in the blue and white box. Use locking tab on box and tape box closed  securely. The blue and white box has prepaid postage on it. Please place it in the mailbox as  soon as possible. Your physician should have your test results approximately 7 days after the  monitor has been mailed back to [facility].  Call [facility] Customer Care at [phone] if you have questions regarding  your ZIO XT patch monitor. Call them immediately if you see an orange light blinking on your  monitor.  If your monitor falls off in less than 4 days, contact our Monitor department at [phone].  If your monitor becomes loose or falls off after 4 days call [employer] at [phone] for  suggestions on securing your monitor    Follow-Up: At [facility], you and your health needs are our priority.  As part of our continuing mission to provide you with exceptional heart care, we have created designated Provider Care Teams.  These Care Teams include your primary Cardiologist (physician) and Advanced Practice Providers (APPs -  Physician Assistants and Nurse Practitioners)  who all work together to provide you with the care you need, when you need it.  We recommend signing up for the patient portal called "MyChart".  Sign up information is provided on this After Visit Summary.  MyChart is used to connect with patients for Virtual Visits (Telemedicine).  Patients are able to view lab/test results, encounter notes, upcoming appointments, etc.  Non-urgent messages can be sent to your provider as well.   To learn more about what you can do with MyChart, go to [email or web address].    Your next appointment:   4 month(s)  The format for your next appointment:   In Person  Provider:   Thomasene Ripple, DO     Other Instructions     Adopting a Healthy Lifestyle.  Know what a healthy weight is for you (roughly BMI <25) and aim to maintain this   Aim for 7+ servings of fruits and vegetables daily   65-80+ fluid ounces of water or unsweet tea for healthy kidneys   Limit to max 1 drink of alcohol per day; avoid smoking/tobacco   Limit animal fats in diet for cholesterol and heart health - choose grass fed whenever available   Avoid highly processed foods, and foods high in saturated/trans fats   Aim for low stress - take time to unwind and care for your mental health   Aim for 150 min of moderate intensity exercise weekly for heart health, and weights twice weekly for bone health   Aim for 7-9 hours of sleep daily   When it comes to diets, agreement about the perfect plan isnt easy to find, even among the experts. Experts at the [facility] [employer] developed an idea known as the Healthy Eating Plate. Just imagine a plate divided into logical, healthy portions.   The emphasis is on diet quality:   Load  up on vegetables and fruits - one-half of your plate: Aim for color and variety, and remember that potatoes dont count.   Go for whole grains - one-quarter of your plate: Whole wheat, barley, wheat berries, quinoa, oats, brown rice, and foods  made with them. If you want pasta, go with whole wheat pasta.   Protein power - one-quarter of your plate: Fish, chicken, beans, and nuts are all healthy, versatile protein sources. Limit red meat.   The diet, however, does go beyond the plate, offering a few other suggestions.   Use healthy plant oils, such as olive, canola, soy, corn, sunflower and peanut. Check the labels, and avoid partially hydrogenated oil, which have unhealthy trans fats.   If youre thirsty, drink water. Coffee and tea are good in moderation, but skip sugary drinks and limit milk and dairy products to one or two daily servings.   The type of carbohydrate in the diet is more important than the amount. Some sources of carbohydrates, such as vegetables, fruits, whole grains, and beans-are healthier than others.   Finally, stay active  Signed, Thomasene Ripple, DO  10/18/2021 3:10 PM    [facility]

## 2021-10-18 DIAGNOSIS — R002 Palpitations: Secondary | ICD-10-CM | POA: Insufficient documentation

## 2021-10-18 DIAGNOSIS — E669 Obesity, unspecified: Secondary | ICD-10-CM | POA: Insufficient documentation

## 2021-10-18 DIAGNOSIS — R079 Chest pain, unspecified: Secondary | ICD-10-CM | POA: Insufficient documentation

## 2021-10-18 DIAGNOSIS — Z79899 Other long term (current) drug therapy: Secondary | ICD-10-CM | POA: Insufficient documentation

## 2021-10-18 LAB — BASIC METABOLIC PANEL
BUN/Creatinine Ratio: 19 (ref 9–23)
BUN: 10 mg/dL (ref 6–24)
CO2: 24 mmol/L (ref 20–29)
Calcium: 8.9 mg/dL (ref 8.7–10.2)
Chloride: 104 mmol/L (ref 96–106)
Creatinine, Ser: 0.53 mg/dL — ABNORMAL LOW (ref 0.57–1.00)
Glucose: 75 mg/dL (ref 70–99)
Potassium: 4.6 mmol/L (ref 3.5–5.2)
Sodium: 139 mmol/L (ref 134–144)
eGFR: 115 mL/min/{1.73_m2} (ref >59)

## 2021-10-18 LAB — TSH+T4F+T3FREE
Free T4: 0.89 ng/dL (ref 0.82–1.77)
T3, Free: 2.7 pg/mL (ref 2.0–4.4)
TSH: 1.55 u[IU]/mL (ref 0.450–4.500)

## 2021-10-18 LAB — C-REACTIVE PROTEIN: CRP: 11 mg/L — ABNORMAL HIGH (ref 0–10)

## 2021-10-18 LAB — MAGNESIUM: Magnesium: 2.2 mg/dL (ref 1.6–2.3)

## 2021-10-18 LAB — SEDIMENTATION RATE: Sed Rate: 18 mm/hr (ref 0–32)

## 2021-10-20 DIAGNOSIS — R002 Palpitations: Secondary | ICD-10-CM

## 2021-10-31 ENCOUNTER — Other Ambulatory Visit (HOSPITAL_BASED_OUTPATIENT_CLINIC_OR_DEPARTMENT_OTHER): Payer: 59

## 2021-11-07 ENCOUNTER — Other Ambulatory Visit (HOSPITAL_BASED_OUTPATIENT_CLINIC_OR_DEPARTMENT_OTHER): Payer: 59

## 2021-11-07 ENCOUNTER — Encounter (HOSPITAL_COMMUNITY): Payer: Self-pay | Admitting: Cardiology

## 2021-11-24 ENCOUNTER — Telehealth: Payer: Self-pay

## 2021-11-24 NOTE — Telephone Encounter (Signed)
Called pt to set up a MyChart visit Friday. No answer, left message for her to return the call.  ?

## 2021-11-28 ENCOUNTER — Encounter: Payer: Self-pay | Admitting: Cardiology

## 2021-11-28 ENCOUNTER — Telehealth: Payer: Self-pay

## 2021-11-28 ENCOUNTER — Telehealth (INDEPENDENT_AMBULATORY_CARE_PROVIDER_SITE_OTHER): Payer: Self-pay | Admitting: Cardiology

## 2021-11-28 VITALS — BP 107/56 | HR 62 | Ht 60.0 in | Wt 158.0 lb

## 2021-11-28 DIAGNOSIS — R002 Palpitations: Secondary | ICD-10-CM

## 2021-11-28 DIAGNOSIS — I471 Supraventricular tachycardia: Secondary | ICD-10-CM

## 2021-11-28 MED ORDER — PROPRANOLOL HCL 10 MG PO TABS: 10.0000 mg | ORAL_TABLET | Freq: Every day | ORAL | 3 refills | Status: DC

## 2021-11-28 NOTE — Telephone Encounter (Signed)
Called pt went over her after visit summary and her follow-up appointment is already set.  ?

## 2021-11-28 NOTE — Patient Instructions (Signed)
Medication Instructions:  ?Your physician has recommended you make the following change in your medication:  ?START: Propranolol 10 mg once daily ?*If you need a refill on your cardiac medications before your next appointment, please call your pharmacy* ? ? ?Lab Work: ?None ?If you have labs (blood work) drawn today and your tests are completely normal, you will receive your results only by: ?MyChart Message (if you have MyChart) OR ?A paper copy in the mail ?If you have any lab test that is abnormal or we need to change your treatment, we will call you to review the results. ? ? ?Testing/Procedures: ?None ? ? ?Follow-Up: ?At [facility], you and your health needs are our priority.  As part of our continuing mission to provide you with exceptional heart care, we have created designated Provider Care Teams.  These Care Teams include your primary Cardiologist (physician) and Advanced Practice Providers (APPs -  Physician Assistants and Nurse Practitioners) who all work together to provide you with the care you need, when you need it. ? ?We recommend signing up for the patient portal called "MyChart".  Sign up information is provided on this After Visit Summary.  MyChart is used to connect with patients for Virtual Visits (Telemedicine).  Patients are able to view lab/test results, encounter notes, upcoming appointments, etc.  Non-urgent messages can be sent to your provider as well.   ?To learn more about what you can do with MyChart, go to [email or web address].   ? ?Your next appointment:   ?July 21st at 9am ?Thomasene Ripple, DO ?[address], [place], [place] [address] ? ? ?The format for your next appointment:   ?In Person ? ?Provider:   ?Thomasene Ripple, DO   ? ?Important Information About Sugar ? ? ? ? ?  ?

## 2021-11-28 NOTE — Progress Notes (Signed)
? ?Virtual Visit via Video Note  ? ?This visit type was conducted due to national recommendations for restrictions regarding the COVID-19 Pandemic (e.g. social distancing) in an effort to limit this patient's exposure and mitigate transmission in our community.  Due to her co-morbid illnesses, this patient is at least at moderate risk for complications without adequate follow up.  This format is felt to be most appropriate for this patient at this time.  All issues noted in this document were discussed and addressed.  A limited physical exam was performed with this format.  Please refer to the patient's chart for her consent to telehealth for [facility]. ? ?   ? ?Date:  11/28/2021  ? ?ID:  Kelsey Lee, DOB [birth year], MRN [id number] ? ?Patient Location: Home ?Provider Location: Office/Clinic ? ?Virtual Visit via Video  Note . I connected with the patient today by a   video enabled telemedicine application and verified that I am speaking with the correct person using two identifiers. ? ? ?PCP:  Patient, No Pcp Per (Inactive)  ?Cardiologist:  Thomasene Ripple, DO  ?Electrophysiologist:  None  ? ?Evaluation Performed:  Follow-Up Visit ? ?Chief Complaint:   ? ?History of Present Illness:   ? ?Kelsey Lee is a 46 y.o. female with hx of traumatic stress disorder, depression, anxiety here today to be evaluated for chest pain.  The patient tells me that this has been going on for many years now.  She lives in [place] back in 2018 she has had episodes where she had multiple chest discomfort.  She states she had a stress test they were concerned about the stress test they gave her nitroglycerin after she had a stress test.  She then again had episodes of chest pain which led to a heart cath at that time which did not show any evidence of coronary artery disease.  She was taken off nitroglycerin. ? ?I saw the patient for the first time on October 17, 2021 at that time she was experiencing intermittent  palpitations associated chest pain.  I placed a monitor on the patient which she did wear and here to discuss today.  Unfortunately she was unable to get her echocardiogram done. ? ?She tells me she still is experiencing intermittent palpitations with associated chest discomfort. ? ?The patient does not have symptoms concerning for COVID-19 infection (fever, chills, cough, or new shortness of breath).  ? ? ?Past Medical History:  ?Diagnosis Date  ? Anxiety   ? Depression   ? Heart murmur   ? Low vitamin D level   ? Psoriasis   ? PTSD (post-traumatic stress disorder)   ? ?Past Surgical History:  ?Procedure Laterality Date  ? CESAREAN SECTION    ? ENDOMETRIAL ABLATION    ? TUBAL LIGATION    ?  ? ?Current Meds  ?Medication Sig  ? FLUoxetine (PROZAC) 20 MG capsule Take 20 mg by mouth every morning.  ? hydrOXYzine (ATARAX) 25 MG tablet Take 25 mg by mouth as needed.  ? prazosin (MINIPRESS) 1 MG capsule Take 2 mg by mouth at bedtime.  ? propranolol (INDERAL) 10 MG tablet Take 1 tablet (10 mg total) by mouth daily.  ?  ? ?Allergies:   Patient has no known allergies.  ? ?Social History  ? ?Tobacco Use  ? Smoking status: Every Day  ?  Types: Cigarettes  ? Smokeless tobacco: Never  ? Tobacco comments:  ?  2 cigs per day  ?Vaping Use  ? Vaping Use:  Never used  ?Substance Use Topics  ? Alcohol use: Not Currently  ? Drug use: Yes  ?  Types: Marijuana  ?  Comment: occasionally  ?  ? ?Family Hx: ?The patient's family history includes Cancer in her father; Diabetes in her father and mother; Hypertension in her father and mother. ? ?ROS:   ?Please see the history of present illness.    ? ?All other systems reviewed and are negative. ? ? ?Prior CV studies:   ?The following studies were reviewed today: ? ?Zio monitor ?  ?Patch Wear Time:  12 days and 21 hours (2023-03-13T21:15:06-399 to 2023-03-26T18:27:25-0400) ?  ?Patient had a min HR of 51 bpm, max HR of 150 bpm, and avg HR of 89 bpm. Predominant underlying rhythm was Sinus  Rhythm.  ?5 Supraventricular Tachycardia runs occurred, the run with the fastest interval lasting 6 beats with a max rate of 150 bpm, the  ?longest lasting 9 beats with an avg rate of 124 bpm. Isolated SVEs were rare (<1.0%), SVE Couplets were rare (<1.0%), and SVE Triplets were rare (<1.0%). Isolated VEs were rare (<1.0%), VE Couplets were rare (<1.0%), and no VE Triplets were present.  ?  ?  ?Symptoms associated with sinus rhythm. ?  ?Conclusion: Rare paroxysmal supraventricular tachycardia. ? ?Labs/Other Tests and Data Reviewed:   ? ?EKG:  No ECG reviewed. ? ?Recent Labs: ?07/27/2021: Hemoglobin 14.3; Platelets 297 ?10/17/2021: BUN 10; Creatinine, Ser 0.53; Magnesium 2.2; Potassium 4.6; Sodium 139; TSH 1.550  ? ?Recent Lipid Panel ?No results found for: CHOL, TRIG, HDL, CHOLHDL, LDLCALC, LDLDIRECT ? ?Wt Readings from Last 3 Encounters:  ?11/28/21 158 lb (71.7 kg)  ?10/17/21 168 lb 9.6 oz (76.5 kg)  ?07/27/21 170 lb (77.1 kg)  ?  ? ?Objective:   ? ?Vital Signs:  BP (!) 107/56   Pulse 62   Ht 5' (1.524 m)   Wt 158 lb (71.7 kg)   BMI 30.86 kg/m?   ? ? ? ?ASSESSMENT & PLAN:   ? ?Paroxysmal SVT ?Palpitations ?Atypical chest pain ?Obesity ? ?Given the paroxysmal SVT though rare seen on her monitor would like to start the patient on low-dose propanolol 10 mg twice a day.  She will start by taking this medicine once and nighttime.  She will use her other dose as needed for palpitations greater than 5 minutes as long as systolic blood pressures greater than 100. ? ?She plans to reschedule her echocardiogram. ? ? ?The patient understands the need to lose weight with diet and exercise. We have discussed specific strategies for this. ? ?COVID-19 Education: ?The signs and symptoms of COVID-19 were discussed with the patient and how to seek care for testing (follow up with PCP or arrange E-visit).  The importance of social distancing was discussed today. ? ?Time:   ?Today, I have spent 12 minutes with the patient with  telehealth technology discussing the above problems.   ? ? ?Medication Adjustments/Labs and Tests Ordered: ?Current medicines are reviewed at length with the patient today.  Concerns regarding medicines are outlined above.  ? ?Tests Ordered: ?No orders of the defined types were placed in this encounter. ? ? ?Medication Changes: ?Meds ordered this encounter  ?Medications  ? propranolol (INDERAL) 10 MG tablet  ?  Sig: Take 1 tablet (10 mg total) by mouth daily.  ?  Dispense:  90 tablet  ?  Refill:  3  ? ? ?Follow Up: Has follow-up appointment in July we will keep that. ? ?Signed, ?Warnie Belair, DO  ?  11/28/2021 9:00 AM    ?[facility]  ?

## 2021-12-01 ENCOUNTER — Telehealth: Payer: Self-pay | Admitting: Licensed Clinical Social Worker

## 2021-12-01 NOTE — Telephone Encounter (Signed)
LCSW attempted to reach pt this morning, no insurance (medicaid family planning only) and no PCP listed. No answer at [phone]. Voicemail left requesting return call.  ? ?Octavio Graves, MSW, LCSW ?Clinical Social Worker II ?[facility]  ?[phone]- work cell phone (preferred) ?[phone]- desk phone ? ?

## 2021-12-01 NOTE — Telephone Encounter (Signed)
LCSW received a return call from pt, she shares that she has lunch 1-2pm Monday through Thursday and then is off Friday from work. Confirmed home address, emergency contacts and added the health department as pt PCP. Pt open to me texting her tomorrow during lunch time to see if she is available to complete full assessment. I will re-attempt at that time.  ? ?Octavio Graves, MSW, LCSW ?Clinical Social Worker II ?[facility]  ?[phone]- work cell phone (preferred) ?[phone]- desk phone ? ?

## 2021-12-02 ENCOUNTER — Telehealth: Payer: Self-pay | Admitting: Licensed Clinical Social Worker

## 2021-12-03 NOTE — Telephone Encounter (Signed)
LCSW texted pt during her lunch break as requested on 4/24 [phone]), inquired if pt would like to speak today about assistance applications, pt requested to speak Friday at 9:00am as she is off work then. I have made note on my calendar and will speak with her then. ? ?Westley Hummer, MSW, LCSW ?Clinical Social Worker II ?[facility]  ?[phone]- work cell phone (preferred) ?[phone]- desk phone ? ?

## 2021-12-05 ENCOUNTER — Telehealth: Payer: Self-pay | Admitting: Licensed Clinical Social Worker

## 2021-12-05 NOTE — Progress Notes (Signed)
?Heart and Vascular Care Navigation ? ?12/05/2021 ? ?Kelsey Lee ?1975/09/15 ?[id number] ? ?Reason for Referral:  ?Uninsured, no pharmacy coverage, ongoing medical needs ?Engaged with patient by telephone for initial visit for Heart and Vascular Care Coordination. ?                                                                                                  ?Assessment:                                     ?LCSW received a call back from pt after leaving voicemail this morning to speak as requested. Introduced self, role, reason for call. Pt confirmed home address, lives with daughters- one of which is < 18yo but pt has only been approved for [facility] Family Planning at this time. She just started a new job, does not have the option to get health insurance through them. She sees the Health Department for primary care, no concerns at this time. LCSW shared that pt may be eligible for three programs to help with bills and ongoing medical care. I shared information briefly about [employer] , [employer] and [employer]. Pt interested in receiving information about all three.  ? ?She accidentally missed the echo scheduled for her, she had forgotten that it was scheduled and intended to cancel it due to cost. She was charged $50 for missing it and wonders if this can assist with that cost- I will have to reach out to our financial counselors to ask that question.  ? ?Pt aware applications will be mailed to her and she will keep her eyes open for that. LCSW remains available before that time for any questions or concerns. ? ?HRT/VAS Care Coordination   ? ? Patients Home Cardiology Office [facility]  ? Outpatient Care Team Social Worker  ? Social Worker Name: Margarito L[facility] [phone]  ? Living arrangements for the past 2 months Single Family Home  ? Lives with: Adult Children; Minor Children  ? Patient Current Insurance Coverage Self-Pay  ? Patient Has  Concern With Paying Medical Bills Yes  ? Patient Concerns With Medical Bills no coverage, only Medicaid Family Planning  ? Medical Bill Referrals: CAFA/[employer]  ? Does Patient Have Prescription Coverage? No  ? Patient Prescription Assistance Programs [place] [facility]  ? [place] [facility] Medications mailed [place] medassist application  ? ?  ? ? ?Social History:                                                                             ?SDOH Screenings  ? ?Alcohol Screen: Not on file  ?Depression (PHQ2-9): Not on file  ?Financial Resource Strain: Low Risk   ? Difficulty of  Paying Living Expenses: Not very hard  ?Food Insecurity: No Food Insecurity  ? Worried About Charity fundraiser in the Last Year: Never true  ? Ran Out of Food in the Last Year: Never true  ?Housing: Low Risk   ? Last Housing Risk Score: 0  ?Physical Activity: Not on file  ?Social Connections: Not on file  ?Stress: Not on file  ?Tobacco Use: High Risk  ? Smoking Tobacco Use: Every Day  ? Smokeless Tobacco Use: Never  ? Passive Exposure: Not on file  ?Transportation Needs: No Transportation Needs  ? Lack of Transportation (Medical): No  ? Lack of Transportation (Non-Medical): No  ? ? ?SDOH Interventions: ?Financial Resources:  Financial Strain Interventions: Other (Comment) ([employer], [employer] , [employer]) ?Financial Counseling for [employer] Program  ?Food Insecurity:  Food Insecurity Interventions: Intervention Not Indicated  ?Housing Insecurity:  Housing Interventions: Intervention Not Indicated  ?Transportation:   Transportation Interventions: Intervention Not Indicated  ? ? ?Other Care Navigation Interventions:    ? ?Provided Pharmacy assistance resources [place] [facility]  ? ?Follow-up plan:   ?LCSW mailed my contact card, [employer], [employer] and [employer]. I will f/u in the next 2 weeks to ensure application received. Pt encouraged to f/u before that time if needs any additional assistance.   ? ? ? ?

## 2021-12-09 ENCOUNTER — Telehealth: Payer: Self-pay | Admitting: Licensed Clinical Social Worker

## 2021-12-10 NOTE — Telephone Encounter (Signed)
LCSW received call from pt. She states that she was told the cost of echo etc and she cannot afford that at this time. I inquired if she had received applications yet. She has not. Explained that I spoke with billing dept head Aram Beecham who shared that if pt eligible for assistance program that charge may be covered as well as echo. Pt will contact me when assistance applications received. I will f/u Friday to see if received by that time.  ? ?Octavio Graves, MSW, LCSW ?Clinical Social Worker II ?[facility]  ?[phone]- work cell phone (preferred) ?[phone]- desk phone ? ?

## 2021-12-15 ENCOUNTER — Telehealth: Payer: Self-pay | Admitting: Licensed Clinical Social Worker

## 2021-12-15 NOTE — Telephone Encounter (Signed)
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 Pt responded to let me know she had received the assistance applications, pt inquiring if needs full bank statement with transactions- confirmed this is needed. I shared she may be able to obtain directly from bank by bringing assistance application. Pt will try that and if any issues will let me know. Continue to follow. ? ?Octavio Graves, MSW, LCSW ?Clinical Social Worker II ?[facility]  ?[phone]- work cell phone (preferred) ?[phone]- desk phone ? ?

## 2021-12-23 ENCOUNTER — Telehealth: Payer: Self-pay | Admitting: Licensed Clinical Social Worker

## 2021-12-23 NOTE — Telephone Encounter (Signed)
?  Follow up message sent to pt via text regarding assistance applications. ?Inquired if pt had any more questions- let them know that I am here throughout the week, off next weeks but that my phone would be monitored for calls during that time by another Education officer, museum.  ?  ?Westley Hummer, MSW, LCSW ?Clinical Social Worker II ?[facility] Navigation  ?[phone]- work cell phone (preferred) ?[phone]- desk phone  ?  ? ?

## 2022-01-23 ENCOUNTER — Telehealth: Payer: Self-pay

## 2022-01-23 ENCOUNTER — Telehealth: Payer: Medicaid Other | Admitting: Physician Assistant

## 2022-01-23 ENCOUNTER — Telehealth: Payer: Self-pay | Admitting: Licensed Clinical Social Worker

## 2022-01-23 DIAGNOSIS — L409 Psoriasis, unspecified: Secondary | ICD-10-CM | POA: Insufficient documentation

## 2022-01-23 MED ORDER — PREDNISONE 10 MG (21) PO TBPK: ORAL_TABLET | ORAL | 0 refills | Status: DC

## 2022-01-23 NOTE — Progress Notes (Signed)
E Visit for Rash  We are sorry that you are not feeling well. Here is how we plan to help!  It appears you have a flare of Psoriasis.   Prednisone 10 mg daily for 6 days (see taper instructions below)  Directions for 6 day taper: Day 1: 2 tablets before breakfast, 1 after both lunch & dinner and 2 at bedtime Day 2: 1 tab before breakfast, 1 after both lunch & dinner and 2 at bedtime Day 3: 1 tab at each meal & 1 at bedtime Day 4: 1 tab at breakfast, 1 at lunch, 1 at bedtime Day 5: 1 tab at breakfast & 1 tab at bedtime Day 6: 1 tab at breakfast   HOME CARE:  Take cool showers and avoid direct sunlight. Apply cool compress or wet dressings. Take a bath in an oatmeal bath.  Sprinkle content of one Aveeno packet under running faucet with comfortably warm water.  Bathe for 15-20 minutes, 1-2 times daily.  Pat dry with a towel. Do not rub the rash. Use hydrocortisone cream. Take an antihistamine like Benadryl for widespread rashes that itch.  The adult dose of Benadryl is 25-50 mg by mouth 4 times daily. Caution:  This type of medication may cause sleepiness.  Do not drink alcohol, drive, or operate dangerous machinery while taking antihistamines.  Do not take these medications if you have prostate enlargement.  Read package instructions thoroughly on all medications that you take.  GET HELP RIGHT AWAY IF:  Symptoms don't go away after treatment. Severe itching that persists. If you rash spreads or swells. If you rash begins to smell. If it blisters and opens or develops a yellow-brown crust. You develop a fever. You have a sore throat. You become short of breath.  MAKE SURE YOU:  Understand these instructions. Will watch your condition. Will get help right away if you are not doing well or get worse.  Thank you for choosing an e-visit.  Your e-visit answers were reviewed by a board certified advanced clinical practitioner to complete your personal care plan. Depending upon the  condition, your plan could have included both over the counter or prescription medications.  Please review your pharmacy choice. Make sure the pharmacy is open so you can pick up prescription now. If there is a problem, you may contact your provider through [employer] and have the prescription routed to another pharmacy.  Your safety is important to [place]. If you have drug allergies check your prescription carefully.   For the next 24 hours you can use MyChart to ask questions about today's visit, request a non-urgent call back, or ask for a work or school excuse. You will get an email in the next two days asking about your experience. I hope that your e-visit has been valuable and will speed your recovery.  I provided 5 minutes of non face-to-face time during this encounter for chart review and documentation.

## 2022-01-23 NOTE — Telephone Encounter (Signed)
H&V Care Navigation CSW Progress Note  Clinical Social Worker contacted patient by phone to f/u on approval for CAFA. LCSW f/u this morning with Mellody Dance, w/ [facility] [employer], he confirmed receiving application for pt, will process it today. I was coincidentally contacted by pt inquiring about account. By the end of the clinic day was able to confirm with pt that account processed and approved for 75% financial assistance, I encouraged her to f/u with [employer] to see if they have any additional updates for that program. Pt appreciative of assistance, aware I'll send her another CAFA letter for her records and make note on her appts. Encouraged her to reach out to me if any additional questions/concerns. I will f/u on [employer] application next week.   Patient is participating in a Managed Medicaid Plan:  No, self pay (medicaid family planning only)  CHARITY COMPLETE. PATIENT APPROVED FOR 75% FINANCIAL ASSISTANCE PER CAFA APPLICATION AND SUPPORTING DOCUMENTATION.  CAFA APP SCANNED TO ACCOUNT [id number] APPROVAL DATES OF FPL ---- 12/26/21 - 06/28/22  APPROVAL LETTER ON ACCOUNT [id number]  SDOH Screenings   Alcohol Screen: Not on file  Depression (PHQ2-9): Not on file  Financial Resource Strain: Low Risk  (12/05/2021)   Overall Financial Resource Strain (CARDIA)    Difficulty of Paying Living Expenses: Not very hard  Food Insecurity: No Food Insecurity (12/05/2021)   Hunger Vital Sign    Worried About Running Out of Food in the Last Year: Never true    Ran Out of Food in the Last Year: Never true  Housing: Low Risk  (12/05/2021)   Housing    Last Housing Risk Score: 0  Physical Activity: Not on file  Social Connections: Not on file  Stress: Not on file  Tobacco Use: High Risk (11/28/2021)   Patient History    Smoking Tobacco Use: Every Day    Smokeless Tobacco Use: Never    Passive Exposure: Not on file  Transportation Needs: No Transportation Needs (12/05/2021)   PRAPARE -  Transportation    Lack of Transportation (Medical): No    Lack of Transportation (Non-Medical): No    Octavio Graves, MSW, LCSW Clinical Social Worker II [facility] Heart/Vascular Care Navigation  [phone]- work cell phone (preferred) [phone]- desk phone

## 2022-01-26 ENCOUNTER — Telehealth: Payer: Self-pay | Admitting: Licensed Clinical Social Worker

## 2022-01-26 NOTE — Telephone Encounter (Signed)
H&V Care Navigation CSW Progress Note  Clinical Social Worker  contacted [employer]  to f/u on application. This Clinical research associate contacted [employer] at [phone] regarding pt application. Per Diplomatic Services operational officer pt application missing document. She transferred me to social workers. Was not able to reach one, left voicemail requesting call back.   Patient is participating in a Managed Medicaid Plan:  No, self pay [facility] Family Planning only)  SDOH Screenings   Alcohol Screen: Not on file  Depression (HKV4-2): Not on file  Financial Resource Strain: Low Risk  (12/05/2021)   Overall Financial Resource Strain (CARDIA)    Difficulty of Paying Living Expenses: Not very hard  Food Insecurity: No Food Insecurity (12/05/2021)   Hunger Vital Sign    Worried About Running Out of Food in the Last Year: Never true    Ran Out of Food in the Last Year: Never true  Housing: Low Risk  (12/05/2021)   Housing    Last Housing Risk Score: 0  Physical Activity: Not on file  Social Connections: Not on file  Stress: Not on file  Tobacco Use: High Risk (11/28/2021)   Patient History    Smoking Tobacco Use: Every Day    Smokeless Tobacco Use: Never    Passive Exposure: Not on file  Transportation Needs: No Transportation Needs (12/05/2021)   PRAPARE - Transportation    Lack of Transportation (Medical): No    Lack of Transportation (Non-Medical): No   Kelsey Lee, MSW, LCSW Clinical Social Worker II [facility] Heart/Vascular Care Navigation  [phone]- work cell phone (preferred) [phone]- desk phone

## 2022-01-27 IMAGING — DX DG CHEST 2V
2 series · 2 of 2 positions shown · non-contrast
Comparison: None.

CLINICAL DATA: Chest pain

EXAM:
CHEST - 2 VIEW

[chest pa]
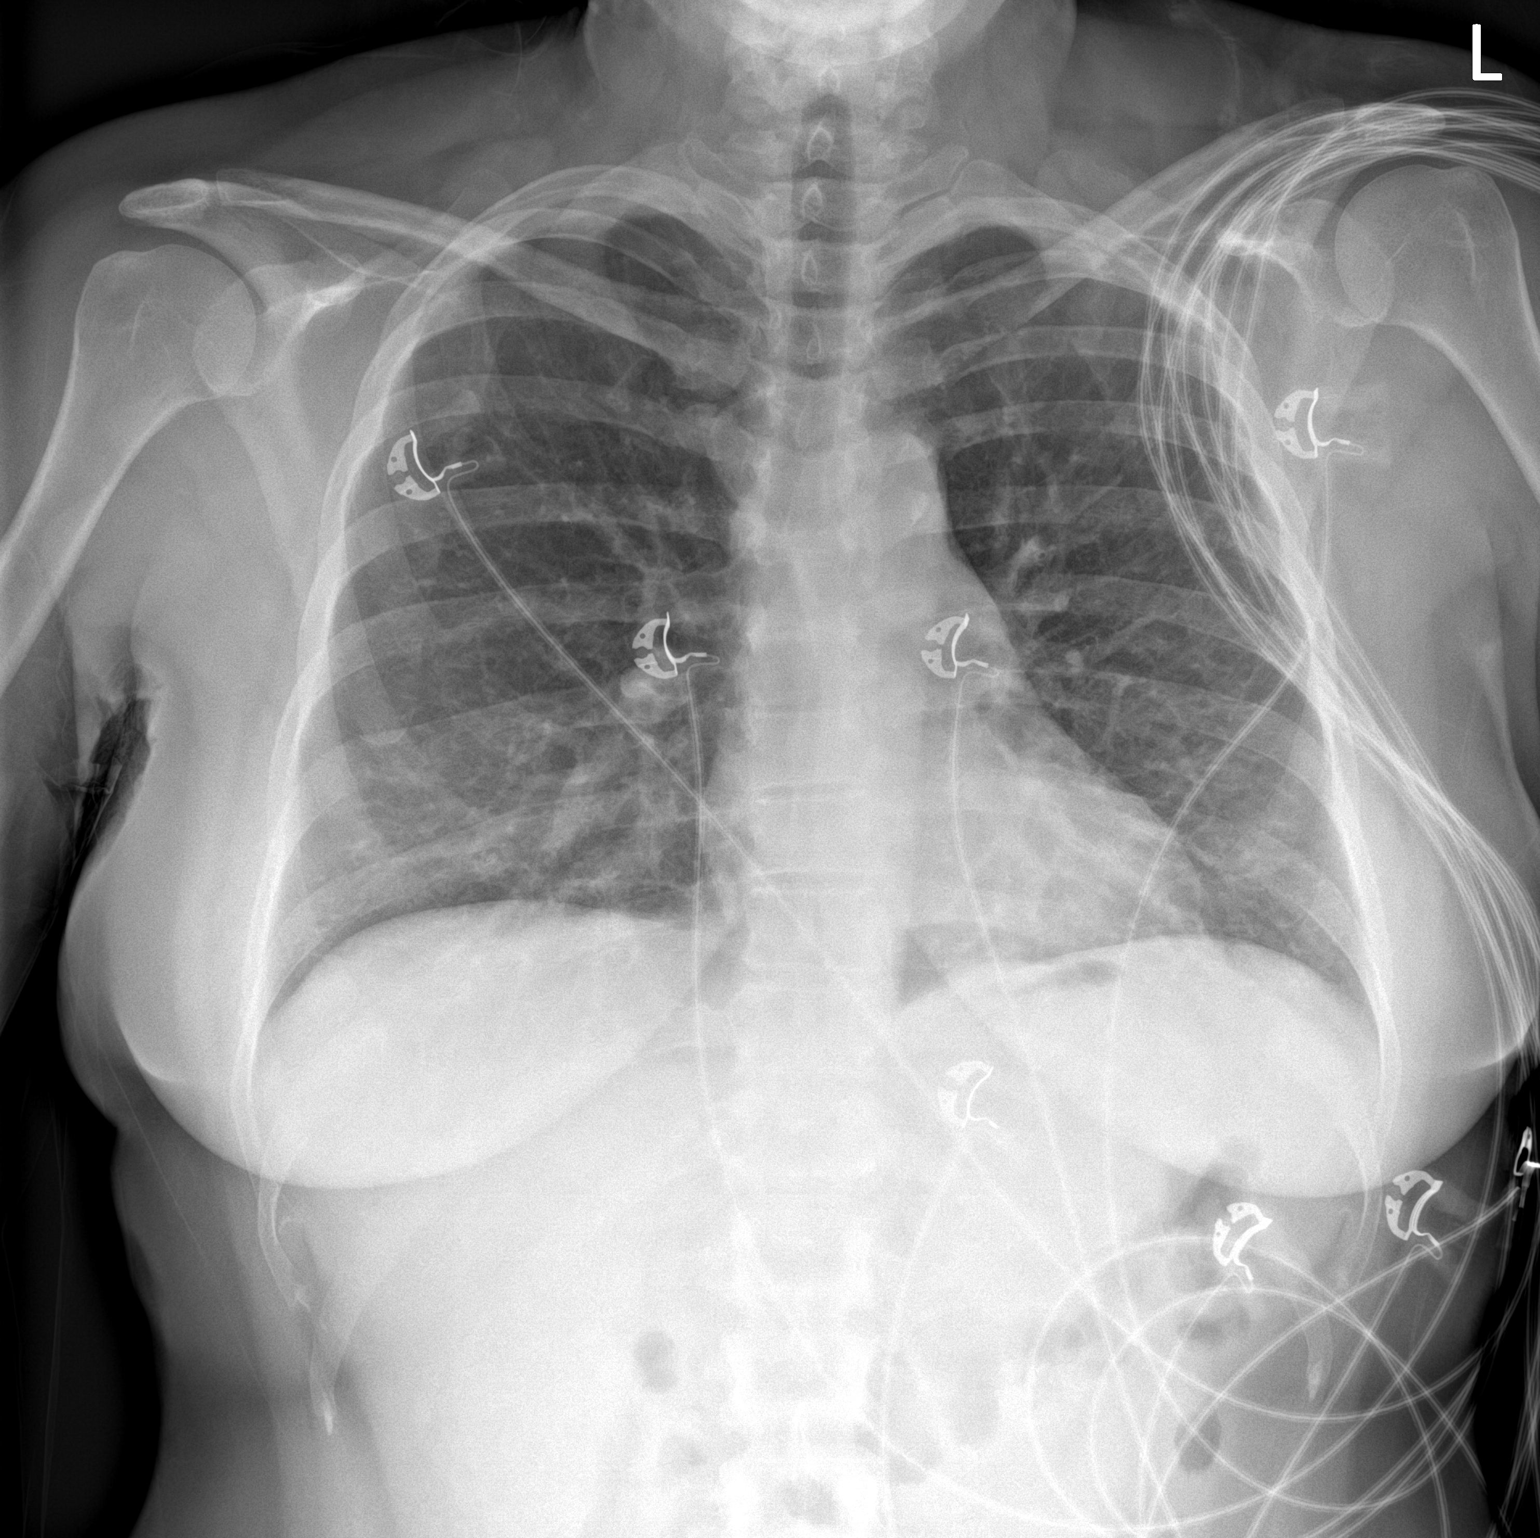

[chest lat]
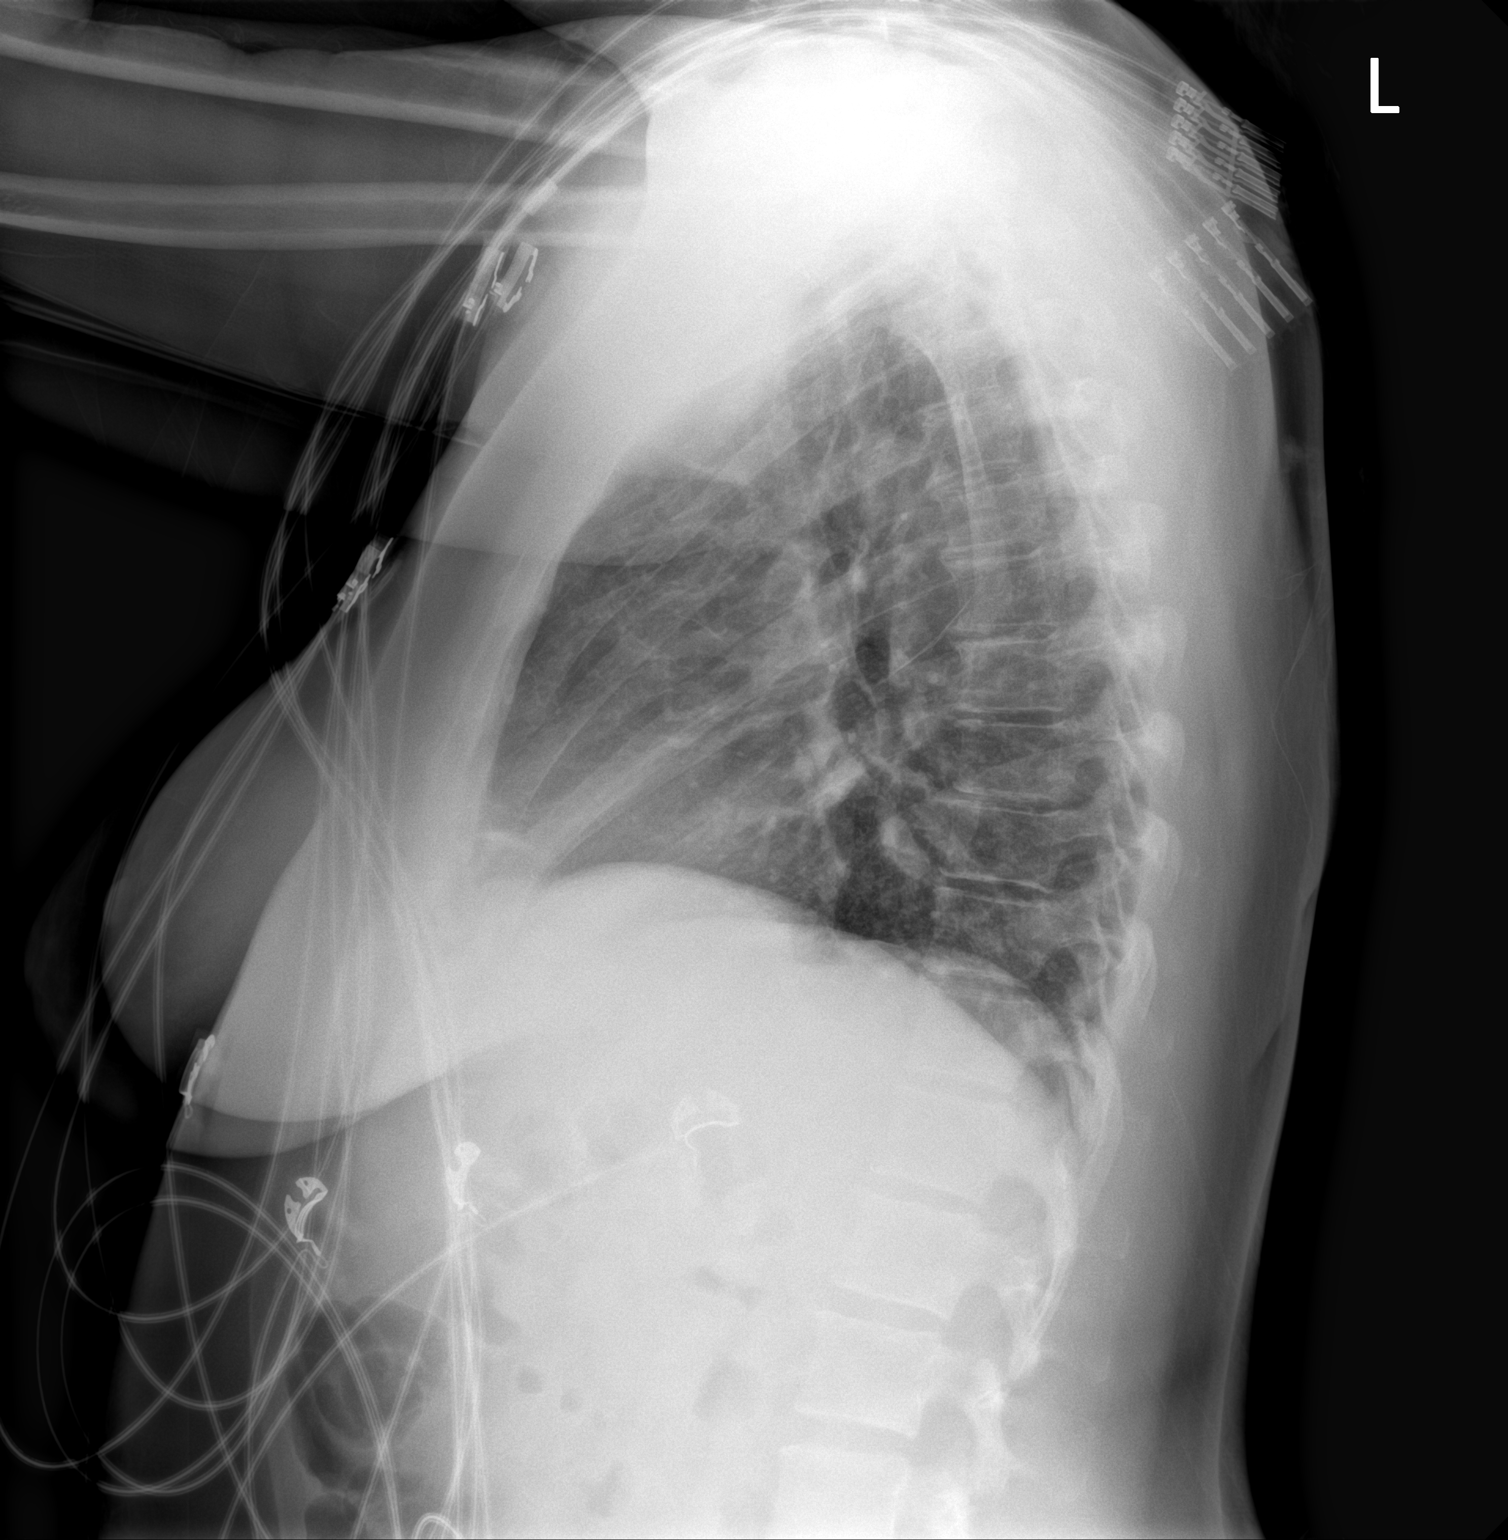

[2 of 2 positions shown; findings below may reference images not displayed]

FINDINGS: Cardiac shadow is within normal limits. Mild bibasilar atelectatic
changes are seen. No focal infiltrate or sizable effusion is noted.
No acute bony abnormality is seen.
IMPRESSION: Mild atelectatic changes in the bases bilaterally.

## 2022-01-30 ENCOUNTER — Telehealth: Payer: Self-pay | Admitting: Licensed Clinical Social Worker

## 2022-02-04 ENCOUNTER — Telehealth: Payer: Self-pay | Admitting: Licensed Clinical Social Worker

## 2022-02-04 NOTE — Telephone Encounter (Signed)
H&V Care Navigation CSW Progress Note  Clinical Social Worker contacted patient by phone to f/u on [employer] approval. Pt approved through 12/09/22 for assistance through their mail order pharmacy. Pt contacted, aware three of her medications are on their formulary (lamotrigine, fluoxetine, and hydroxyzine) she is aware that her additional medications are unfortunately not available on their formulary. She will need to contact the provider (s) managing those medications and request that they be sent either e-scribed to [facility] or faxed to [phone]. Pt responded thanking this Clinical research associate. No additional questions at this time.   Patient is participating in a Managed Medicaid Plan:  No, self pay only (approved for 75% CAFA discount)  SDOH Screenings   Alcohol Screen: Not on file  Depression (HBZ1-6): Not on file  Financial Resource Strain: Low Risk  (12/05/2021)   Overall Financial Resource Strain (CARDIA)    Difficulty of Paying Living Expenses: Not very hard  Food Insecurity: No Food Insecurity (12/05/2021)   Hunger Vital Sign    Worried About Running Out of Food in the Last Year: Never true    Ran Out of Food in the Last Year: Never true  Housing: Low Risk  (12/05/2021)   Housing    Last Housing Risk Score: 0  Physical Activity: Not on file  Social Connections: Not on file  Stress: Not on file  Tobacco Use: High Risk (11/28/2021)   Patient History    Smoking Tobacco Use: Every Day    Smokeless Tobacco Use: Never    Passive Exposure: Not on file  Transportation Needs: No Transportation Needs (12/05/2021)   PRAPARE - Transportation    Lack of Transportation (Medical): No    Lack of Transportation (Non-Medical): No   Octavio Graves, MSW, LCSW Clinical Social Worker II [facility] Heart/Vascular Care Navigation  [phone]- work cell phone (preferred) [phone]- desk phone

## 2022-02-27 ENCOUNTER — Ambulatory Visit: Payer: 59 | Admitting: Cardiology

## 2022-03-28 ENCOUNTER — Telehealth: Payer: Self-pay | Admitting: Nurse Practitioner

## 2022-03-28 DIAGNOSIS — B353 Tinea pedis: Secondary | ICD-10-CM

## 2022-03-28 MED ORDER — CLOTRIMAZOLE 1 % EX SOLN: 1.0000 | Freq: Two times a day (BID) | CUTANEOUS | 0 refills | Status: DC

## 2022-03-28 MED ORDER — TERBINAFINE HCL 250 MG PO TABS: 250.0000 mg | ORAL_TABLET | Freq: Every day | ORAL | 0 refills | Status: DC

## 2022-03-28 NOTE — Progress Notes (Signed)
E-Visit for Athlete's Foot  We are sorry that you are not feeling well. Here is how we plan to help!  Based on what you shared with me it looks like you have tinea pedis, or "Athlete's Foot".  This type of rash can spread through shared towels, clothing, bedding, etc., as well as hard surfaces (particularly in moist areas) such as shower stalls, locker room floors, pool areas, etc. The symptoms of Athlete's Foot include red, swollen, peeling, itchy skin between the toes (especially between the pinky toe and the one next to it). The sole and heel of the foot may also be affected. In severe cases, the skin on the feet can blister.  Athlete's foot can usually be treated with over-the-counter topical antifungal products; but sometimes with chronic or extensive tinea pedis, prescription oral medications are needed.   I am recommending:Clotrimazole 1% cream or gel, apply to area twice per day   Prescription medications are only indicated for an extensive rash or if over the counter treatments have failed.  I am prescribing:Terbinafine 250 mg once per day for one week  HOME CARE:  Keep feet clean, dry, and cool. Avoid using swimming pools, public showers, or foot baths. Wear sandals when possible or air shoes out by alternating them every 2-3 days. Avoid wearing closed shoes and wearing socks made from fabric that doesn't dry easily (for example, nylon). Treat the infection with recommended medication  GET HELP RIGHT AWAY IF:  Symptoms that don't go away after treatment. Severe itching that persists. If your rash spreads or swells. If your rash begins to have drainage or smell. You develop a fever.  MAKE SURE YOU   Understand these instructions. Will watch your condition. Will get help right away if you are not doing well or get worse.   Thank you for choosing an e-visit.  Your e-visit answers were reviewed by a board certified advanced clinical practitioner to complete your  personal care plan. Depending upon the condition, your plan could have included both over the counter or prescription medications.  Please review your pharmacy choice. Make sure the pharmacy is open so you can pick up prescription now. If there is a problem, you may contact your provider through MyChart messaging and have the prescription routed to another pharmacy.  Your safety is important to us. If you have drug allergies check your prescription carefully.   For the next 24 hours you can use MyChart to ask questions about today's visit, request a non-urgent call back, or ask for a work or school excuse.  You will get an email in the next two days asking about your experience. I hope that your e-visit has been valuable and will speed your recovery  References or for more information:  https://www.uptodate.com/contents/dermatophyte-tinea-infections?search=athletes%20foot%20treatment&source=search_result&selectedTitle=1~104&usage_type=default&display_rank=1  https://www.cdc.gov/healthywater/hygiene/disease/athletes_foot.html   5-10 minutes spent reviewing and documenting in chart.       

## 2022-04-06 ENCOUNTER — Telehealth: Payer: Self-pay | Admitting: Family

## 2022-04-06 DIAGNOSIS — B353 Tinea pedis: Secondary | ICD-10-CM

## 2022-04-06 MED ORDER — KETOCONAZOLE 2 % EX CREA: 1.0000 | TOPICAL_CREAM | Freq: Every day | CUTANEOUS | 1 refills | Status: DC

## 2022-04-06 MED ORDER — FLUCONAZOLE 150 MG PO TABS: 150.0000 mg | ORAL_TABLET | ORAL | 0 refills | Status: DC | PRN

## 2022-04-06 NOTE — Progress Notes (Signed)
E-Visit for Athlete's Foot  We are sorry that you are not feeling well. Here is how we plan to help!  Based on what you shared with me it looks like you have tinea pedis, or "Athlete's Foot".  This type of rash can spread through shared towels, clothing, bedding, etc., as well as hard surfaces (particularly in moist areas) such as shower stalls, locker room floors, pool areas, etc. The symptoms of Athlete's Foot include red, swollen, peeling, itchy skin between the toes (especially between the pinky toe and the one next to it). The sole and heel of the foot may also be affected. In severe cases, the skin on the feet can blister.  Athlete's foot can usually be treated with over-the-counter topical antifungal products; but sometimes with chronic or extensive tinea pedis, prescription oral medications are needed.   I am recommending:Ketoconazole 2% cream or gel, apply to area twice per day   Prescription medications are only indicated for an extensive rash or if over the counter treatments have failed.  I am prescribing:Fluconazole 150 mg once weekly for four weeks  HOME CARE:  Keep feet clean, dry, and cool. Avoid using swimming pools, public showers, or foot baths. Wear sandals when possible or air shoes out by alternating them every 2-3 days. Avoid wearing closed shoes and wearing socks made from fabric that doesn't dry easily (for example, nylon). Treat the infection with recommended medication  GET HELP RIGHT AWAY IF:  Symptoms that don't go away after treatment. Severe itching that persists. If your rash spreads or swells. If your rash begins to have drainage or smell. You develop a fever.  MAKE SURE YOU   Understand these instructions. Will watch your condition. Will get help right away if you are not doing well or get worse.   Thank you for choosing an e-visit.  Your e-visit answers were reviewed by a board certified advanced clinical practitioner to complete  your personal care plan. Depending upon the condition, your plan could have included both over the counter or prescription medications.  Please review your pharmacy choice. Make sure the pharmacy is open so you can pick up prescription now. If there is a problem, you may contact your provider through [employer] and have the prescription routed to another pharmacy.  Your safety is important to [place]. If you have drug allergies check your prescription carefully.   For the next 24 hours you can use MyChart to ask questions about today's visit, request a non-urgent call back, or ask for a work or school excuse.  You will get an email in the next two days asking about your experience. I hope that your e-visit has been valuable and will speed your recovery  References or for more information:  [employer]us.com.au?search=athletes%32foot%20treatment&source=search_result&selectedTitle=1~104&usage_type=default&display_rank=1  [email or web address]    Approximately 5 minutes was spent documenting and reviewing patient's chart.

## 2022-04-17 DIAGNOSIS — E559 Vitamin D deficiency, unspecified: Secondary | ICD-10-CM | POA: Diagnosis not present

## 2022-04-17 DIAGNOSIS — Z01419 Encounter for gynecological examination (general) (routine) without abnormal findings: Secondary | ICD-10-CM | POA: Diagnosis not present

## 2022-04-17 DIAGNOSIS — F411 Generalized anxiety disorder: Secondary | ICD-10-CM | POA: Diagnosis not present

## 2022-04-17 DIAGNOSIS — Z1329 Encounter for screening for other suspected endocrine disorder: Secondary | ICD-10-CM | POA: Diagnosis not present

## 2022-04-17 DIAGNOSIS — Z13220 Encounter for screening for lipoid disorders: Secondary | ICD-10-CM | POA: Diagnosis not present

## 2022-04-17 DIAGNOSIS — Z130 Encounter for screening for diseases of the blood and blood-forming organs and certain disorders involving the immune mechanism: Secondary | ICD-10-CM | POA: Diagnosis not present

## 2022-04-17 DIAGNOSIS — Z7182 Exercise counseling: Secondary | ICD-10-CM | POA: Diagnosis not present

## 2022-04-17 DIAGNOSIS — L409 Psoriasis, unspecified: Secondary | ICD-10-CM | POA: Diagnosis not present

## 2022-04-17 DIAGNOSIS — Z131 Encounter for screening for diabetes mellitus: Secondary | ICD-10-CM | POA: Diagnosis not present

## 2022-04-17 DIAGNOSIS — B353 Tinea pedis: Secondary | ICD-10-CM | POA: Diagnosis not present

## 2022-04-17 DIAGNOSIS — R009 Unspecified abnormalities of heart beat: Secondary | ICD-10-CM | POA: Diagnosis not present

## 2022-04-17 DIAGNOSIS — F410 Panic disorder [episodic paroxysmal anxiety] without agoraphobia: Secondary | ICD-10-CM | POA: Diagnosis not present

## 2022-04-17 DIAGNOSIS — Z0189 Encounter for other specified special examinations: Secondary | ICD-10-CM | POA: Diagnosis not present

## 2022-04-17 DIAGNOSIS — Z6830 Body mass index (BMI) 30.0-30.9, adult: Secondary | ICD-10-CM | POA: Diagnosis not present

## 2022-04-17 DIAGNOSIS — Z113 Encounter for screening for infections with a predominantly sexual mode of transmission: Secondary | ICD-10-CM | POA: Diagnosis not present

## 2022-04-17 DIAGNOSIS — F331 Major depressive disorder, recurrent, moderate: Secondary | ICD-10-CM | POA: Diagnosis not present

## 2022-04-17 DIAGNOSIS — B351 Tinea unguium: Secondary | ICD-10-CM | POA: Diagnosis not present

## 2022-04-17 DIAGNOSIS — Z9851 Tubal ligation status: Secondary | ICD-10-CM | POA: Diagnosis not present

## 2022-04-17 DIAGNOSIS — F4310 Post-traumatic stress disorder, unspecified: Secondary | ICD-10-CM | POA: Diagnosis not present

## 2022-04-17 DIAGNOSIS — Z713 Dietary counseling and surveillance: Secondary | ICD-10-CM | POA: Diagnosis not present

## 2022-04-20 ENCOUNTER — Other Ambulatory Visit: Payer: Self-pay

## 2022-04-20 DIAGNOSIS — Z1231 Encounter for screening mammogram for malignant neoplasm of breast: Secondary | ICD-10-CM

## 2022-04-21 DIAGNOSIS — F4323 Adjustment disorder with mixed anxiety and depressed mood: Secondary | ICD-10-CM | POA: Diagnosis not present

## 2022-04-24 ENCOUNTER — Ambulatory Visit (HOSPITAL_COMMUNITY): Payer: Medicaid Other | Attending: Cardiovascular Disease

## 2022-04-24 DIAGNOSIS — R079 Chest pain, unspecified: Secondary | ICD-10-CM | POA: Insufficient documentation

## 2022-04-24 LAB — ECHOCARDIOGRAM COMPLETE
Area-P 1/2: 4.17 cm2
S' Lateral: 2.3 cm

## 2022-05-01 DIAGNOSIS — F4310 Post-traumatic stress disorder, unspecified: Secondary | ICD-10-CM | POA: Diagnosis not present

## 2022-05-01 DIAGNOSIS — F331 Major depressive disorder, recurrent, moderate: Secondary | ICD-10-CM | POA: Diagnosis not present

## 2022-05-01 DIAGNOSIS — F410 Panic disorder [episodic paroxysmal anxiety] without agoraphobia: Secondary | ICD-10-CM | POA: Diagnosis not present

## 2022-05-01 DIAGNOSIS — F411 Generalized anxiety disorder: Secondary | ICD-10-CM | POA: Diagnosis not present

## 2022-05-05 DIAGNOSIS — F4323 Adjustment disorder with mixed anxiety and depressed mood: Secondary | ICD-10-CM | POA: Diagnosis not present

## 2022-05-08 ENCOUNTER — Ambulatory Visit: Payer: Medicaid Other | Admitting: Adult Health

## 2022-05-12 DIAGNOSIS — F4323 Adjustment disorder with mixed anxiety and depressed mood: Secondary | ICD-10-CM | POA: Diagnosis not present

## 2022-05-13 ENCOUNTER — Ambulatory Visit: Payer: Medicaid Other | Admitting: Physician Assistant

## 2022-05-15 ENCOUNTER — Encounter: Payer: Self-pay | Admitting: Physician Assistant

## 2022-05-15 ENCOUNTER — Ambulatory Visit
Admission: RE | Admit: 2022-05-15 | Discharge: 2022-05-15 | Disposition: A | Discharge status: Home / Self Care | Payer: Medicaid Other | Source: Ambulatory Visit | Attending: Physician Assistant | Admitting: Physician Assistant

## 2022-05-15 ENCOUNTER — Ambulatory Visit: Payer: Medicaid Other | Attending: Physician Assistant | Admitting: Physician Assistant

## 2022-05-15 VITALS — BP 99/66 | HR 76 | Ht 60.0 in | Wt 156.2 lb

## 2022-05-15 DIAGNOSIS — R0789 Other chest pain: Secondary | ICD-10-CM

## 2022-05-15 DIAGNOSIS — Z1231 Encounter for screening mammogram for malignant neoplasm of breast: Secondary | ICD-10-CM | POA: Diagnosis not present

## 2022-05-15 DIAGNOSIS — L409 Psoriasis, unspecified: Secondary | ICD-10-CM | POA: Diagnosis not present

## 2022-05-15 DIAGNOSIS — B353 Tinea pedis: Secondary | ICD-10-CM | POA: Diagnosis not present

## 2022-05-15 MED ORDER — PROPRANOLOL HCL 10 MG PO TABS: 10.0000 mg | ORAL_TABLET | Freq: Every day | ORAL | 3 refills | Status: DC

## 2022-05-15 NOTE — Progress Notes (Signed)
Cardiology Office Note:    Date:  05/17/2022   ID:  Kelsey Lee, DOB [birth year], MRN [id number]  PCP:  Department, [place] Prov[facility] Cardiologist:  Berniece Salines, DO     Referring MD: Department, [place]   Chief Complaint  Patient presents with   Follow-up    Seen for Dr. Harriet Masson    History of Present Illness:    Kelsey Lee is a 46 y.o. female with a hx of traumatic stress disorder, depression/anxiety and history of chest pain.  Patient initially lived in [place] back in 2018 where she had episodes of chest discomfort.  She had a stress test there, this led to a cardiac catheterization that did not show any evidence of CAD.  Patient was recently seen by Dr. Harriet Masson again in March 2023 at which time she was experiencing some chest discomfort.  Heart monitor shows minimal heart rate 51, maximal heart rate 150, average heart rate 89.  5 episodes of supraventricular tachycardia with the fastest interval lasting 6 beats, longest interval lasting 9 beats.  Rare PVCs.  Echocardiogram showed EF 60 to 65%, normal RV, no significant valve issue.  Lab work shows normal thyroid level, normal sed rate, borderline elevated CRP.  Patient presents today for follow-up.  She says her previous cardiologist is Dr. Amalia Hailey in [place].  I would like to request a record of the previous cath report.  She was told she had a hole in the heart but does not need to be fixed, I suspect it is a PFO.  She was also told to start on baby aspirin after the previous cardiac catheterization.  She still has occasional chest discomfort, this is more related to emotional stress rather than physical stress.  No further work-up is planned at this time.  She can follow-up in 6 months.   Past Medical History:  Diagnosis Date   Anxiety    Depression    Heart murmur    Low vitamin D level    Psoriasis    PTSD (post-traumatic stress disorder)     Past Surgical  History:  Procedure Laterality Date   CESAREAN SECTION     ENDOMETRIAL ABLATION     TUBAL LIGATION      Current Medications: Current Meds  Medication Sig   hydrOXYzine (ATARAX) 25 MG tablet Take 25 mg by mouth as needed.   ketoconazole (NIZORAL) 2 % cream Apply 1 Application topically daily.   [DISCONTINUED] propranolol (INDERAL) 10 MG tablet Take 1 tablet (10 mg total) by mouth daily.     Allergies:   Patient has no known allergies.   Social History   Socioeconomic History   Marital status: Kelsey Lee    Spouse name: Not on file   Number of children: Not on file   Years of education: Not on file   Highest education level: Not on file  Occupational History   Not on file  Tobacco Use   Smoking status: Every Day    Types: Cigarettes   Smokeless tobacco: Never   Tobacco comments:    2 cigs per day  Vaping Use   Vaping Use: Never used  Substance and Sexual Activity   Alcohol use: Not Currently   Drug use: Yes    Types: Marijuana    Comment: occasionally   Sexual activity: Not on file  Other Topics Concern   Not on file  Social History Narrative   Not on file  Social Determinants of Health   Financial Resource Strain: Low Risk  (12/05/2021)   Overall Financial Resource Strain (CARDIA)    Difficulty of Paying Living Expenses: Not very hard  Food Insecurity: No Food Insecurity (12/05/2021)   Hunger Vital Sign    Worried About Running Out of Food in the Last Year: Never true    Ran Out of Food in the Last Year: Never true  Transportation Needs: No Transportation Needs (12/05/2021)   PRAPARE - Administrator, Civil Service (Medical): No    Lack of Transportation (Non-Medical): No  Physical Activity: Not on file  Stress: Not on file  Social Connections: Not on file     Family History: The patient's family history includes Cancer in her father; Diabetes in her father and mother; Hypertension in her father and mother.  ROS:   Please see the  history of present illness.     All other systems reviewed and are negative.  EKGs/Labs/Other Studies Reviewed:    The following studies were reviewed today:  Echo 04/24/2022  1. Left ventricular ejection fraction, by estimation, is 60 to 65%. Left  ventricular ejection fraction by 3D volume is 64 %. The left ventricle has  normal function. The left ventricle has no regional wall motion  abnormalities. Left ventricular diastolic   parameters were normal.   2. Right ventricular systolic function is normal. The right ventricular  size is normal.   3. The mitral valve is normal in structure. Trivial mitral valve  regurgitation. No evidence of mitral stenosis.   4. The aortic valve is normal in structure. Aortic valve regurgitation is  not visualized. No aortic stenosis is present.   5. The inferior vena cava is normal in size with greater than 50%  respiratory variability, suggesting right atrial pressure of 3 mmHg.   EKG:  EKG is not ordered today.    Recent Labs: 07/27/2021: Hemoglobin 14.3; Platelets 297 10/17/2021: BUN 10; Creatinine, Ser 0.53; Magnesium 2.2; Potassium 4.6; Sodium 139; TSH 1.550  Recent Lipid Panel No results found for: "CHOL", "TRIG", "HDL", "CHOLHDL", "VLDL", "LDLCALC", "LDLDIRECT"   Risk Assessment/Calculations:           Physical Exam:    VS:  BP 99/66   Pulse 76   Ht 5' (1.524 m)   Wt 156 lb 3.2 oz (70.9 kg)   SpO2 96%   BMI 30.51 kg/m        Wt Readings from Last 3 Encounters:  05/15/22 156 lb 3.2 oz (70.9 kg)  11/28/21 158 lb (71.7 kg)  10/17/21 168 lb 9.6 oz (76.5 kg)     GEN:  Well nourished, well developed in no acute distress HEENT: Normal NECK: No JVD; No carotid bruits LYMPHATICS: No lymphadenopathy CARDIAC: RRR, no murmurs, rubs, gallops RESPIRATORY:  Clear to auscultation without rales, wheezing or rhonchi  ABDOMEN: Soft, non-tender, non-distended MUSCULOSKELETAL:  No edema; No deformity  SKIN: Warm and dry NEUROLOGIC:   Alert and oriented x 3 PSYCHIATRIC:  Normal affect   ASSESSMENT:    1. Atypical chest pain    PLAN:    In order of problems listed above:  Atypical chest pain: Previous cardiac catheterization in [place] showed normal coronary arteries.  Her chest pain is quite atypical and only occurs with emotional stress.  We will hold off on further work-up at this time.  Recent echocardiogram obtained in September 2023 showed normal EF, no regional wall motion abnormality.  Medication Adjustments/Labs and Tests Ordered: Current medicines are reviewed at length with the patient today.  Concerns regarding medicines are outlined above.  No orders of the defined types were placed in this encounter.  Meds ordered this encounter  Medications   propranolol (INDERAL) 10 MG tablet    Sig: Take 1 tablet (10 mg total) by mouth daily.    Dispense:  90 tablet    Refill:  3    Patient Instructions  Medication Instructions:  Your physician recommends that you continue on your current medications as directed. Please refer to the Current Medication list given to you today.  *If you need a refill on your cardiac medications before your next appointment, please call your pharmacy*  Lab Work: NONE ordered at this time of appointment   If you have labs (blood work) drawn today and your tests are completely normal, you will receive your results only by: MyChart Message (if you have MyChart) OR A paper copy in the mail If you have any lab test that is abnormal or we need to change your treatment, we will call you to review the results.  Testing/Procedures: NONE ordered at this time of appointment   Follow-Up: At [facility], you and your health needs are our priority.  As part of our continuing mission to provide you with exceptional heart care, we have created designated Provider Care Teams.  These Care Teams include your primary Cardiologist (physician) and Advanced Practice Providers  (APPs -  Physician Assistants and Nurse Practitioners) who all work together to provide you with the care you need, when you need it.  Your next appointment:   6 month(s)  The format for your next appointment:   In Person  Provider:   Thomasene Ripple, DO     Other Instructions   Important Information About Sugar         Ramond Dial, [place]  05/17/2022 10:07 PM    [facility]

## 2022-05-15 NOTE — Patient Instructions (Signed)
Medication Instructions:  Your physician recommends that you continue on your current medications as directed. Please refer to the Current Medication list given to you today.  *If you need a refill on your cardiac medications before your next appointment, please call your pharmacy*  Lab Work: NONE ordered at this time of appointment   If you have labs (blood work) drawn today and your tests are completely normal, you will receive your results only by: MyChart Message (if you have MyChart) OR A paper copy in the mail If you have any lab test that is abnormal or we need to change your treatment, we will call you to review the results.  Testing/Procedures: NONE ordered at this time of appointment   Follow-Up: At [facility], you and your health needs are our priority.  As part of our continuing mission to provide you with exceptional heart care, we have created designated Provider Care Teams.  These Care Teams include your primary Cardiologist (physician) and Advanced Practice Providers (APPs -  Physician Assistants and Nurse Practitioners) who all work together to provide you with the care you need, when you need it.  Your next appointment:   6 month(s)  The format for your next appointment:   In Person  Provider:   Kardie Tobb, DO     Other Instructions   Important Information About Sugar       

## 2022-05-17 ENCOUNTER — Encounter: Payer: Self-pay | Admitting: Physician Assistant

## 2022-05-19 DIAGNOSIS — F4323 Adjustment disorder with mixed anxiety and depressed mood: Secondary | ICD-10-CM | POA: Diagnosis not present

## 2022-05-25 ENCOUNTER — Encounter (HOSPITAL_COMMUNITY): Payer: Self-pay

## 2022-05-25 ENCOUNTER — Ambulatory Visit (HOSPITAL_COMMUNITY)
Admission: EM | Admit: 2022-05-25 | Discharge: 2022-05-25 | Disposition: A | Discharge status: Home / Self Care | Payer: Medicaid Other | Attending: Emergency Medicine | Admitting: Emergency Medicine

## 2022-05-25 DIAGNOSIS — T781XXA Other adverse food reactions, not elsewhere classified, initial encounter: Secondary | ICD-10-CM | POA: Diagnosis not present

## 2022-05-25 DIAGNOSIS — Z91013 Allergy to seafood: Secondary | ICD-10-CM

## 2022-05-25 DIAGNOSIS — L509 Urticaria, unspecified: Secondary | ICD-10-CM

## 2022-05-25 MED ORDER — FAMOTIDINE 20 MG PO TABS: 20.0000 mg | ORAL_TABLET | Freq: Every evening | ORAL | 0 refills | Status: DC

## 2022-05-25 MED ORDER — FAMOTIDINE 20 MG PO TABS
20.0000 mg | ORAL_TABLET | Freq: Once | ORAL | Status: AC
  Administered 2022-05-25: 20 mg via ORAL

## 2022-05-25 MED ORDER — METHYLPREDNISOLONE SODIUM SUCC 125 MG IJ SOLR
INTRAMUSCULAR | Status: AC
  Filled 2022-05-25: qty 2

## 2022-05-25 MED ORDER — METHYLPREDNISOLONE SODIUM SUCC 125 MG IJ SOLR
60.0000 mg | Freq: Once | INTRAMUSCULAR | Status: AC
  Administered 2022-05-25: 0.96 mg via INTRAMUSCULAR

## 2022-05-25 MED ORDER — DIPHENHYDRAMINE HCL 25 MG PO CAPS
25.0000 mg | ORAL_CAPSULE | Freq: Once | ORAL | Status: AC
  Administered 2022-05-25: 25 mg via ORAL

## 2022-05-25 MED ORDER — CETIRIZINE HCL 10 MG PO TABS: 10.0000 mg | ORAL_TABLET | Freq: Every day | ORAL | 2 refills | Status: DC

## 2022-05-25 MED ORDER — FAMOTIDINE 20 MG PO TABS
ORAL_TABLET | ORAL | Status: AC
  Filled 2022-05-25: qty 1

## 2022-05-25 MED ORDER — DIPHENHYDRAMINE HCL 25 MG PO CAPS
ORAL_CAPSULE | ORAL | Status: AC
  Filled 2022-05-25: qty 1

## 2022-05-25 NOTE — Discharge Instructions (Addendum)
I recommend a daily regimen of Zyrtec in the morning and Pepcid at night. Try this for the next week or so. These medicines can control itching.  Please follow up with your primary care provider if you wish to have allergy testing performed. I recommend avoiding shrimp and other shellfish in the meantime.  If you develop difficulty breathing please go directly to the emergency department.

## 2022-05-25 NOTE — ED Triage Notes (Signed)
Pt states woke up with swelling and numbness to bottom lip and tip of her tongue and itchy all over. States had an allergic reaction to shrimp on Friday, took benadryl and it went away. Denies taking any meds today. No distress.

## 2022-05-25 NOTE — ED Provider Notes (Signed)
Kelsey Lee    CSN: [id number] Arrival date & time: 05/25/22  0805      History   Chief Complaint Chief Complaint  Patient presents with   Allergic Reaction    HPI Kelsey Lee is a 46 y.o. female.  Presents with allergic reaction Patient reports she ate shrimp on Friday.  She "blew up like a lobster". Had swelling around her face and itching all over her body. Took Benadryl which cleared the reaction. This morning she woke up with swelling of her lower lip, tingling of her tongue, itching all over her body.  Denies any trouble breathing.   She has eaten shrimp in the past causing itching  Past Medical History:  Diagnosis Date   Anxiety    Depression    Heart murmur    Low vitamin D level    Psoriasis    PTSD (post-traumatic stress disorder)     Patient Active Problem List   Diagnosis Date Noted   Psoriasis    Medication management 10/18/2021   Chest pain of uncertain etiology 29/51/8841   Palpitations 10/18/2021   Obesity (BMI 30-39.9) 10/18/2021    Past Surgical History:  Procedure Laterality Date   CESAREAN SECTION     ENDOMETRIAL ABLATION     TUBAL LIGATION      OB History   No obstetric history on file.      Home Medications    Prior to Admission medications   Medication Sig Start Date End Date Taking? Authorizing Provider  cetirizine (ZYRTEC ALLERGY) 10 MG tablet Take 1 tablet (10 mg total) by mouth daily. 05/25/22  Yes Anyia Gierke, Wells Guiles, PA-C  famotidine (PEPCID) 20 MG tablet Take 1 tablet (20 mg total) by mouth at bedtime. 05/25/22  Yes Neil Brickell, Wells Guiles, PA-C  hydrOXYzine (ATARAX) 25 MG tablet Take 25 mg by mouth as needed.    [provider]  ketoconazole (NIZORAL) 2 % cream Apply 1 Application topically daily. 04/06/22   Sharion Balloon, FNP  propranolol (INDERAL) 10 MG tablet Take 1 tablet (10 mg total) by mouth daily. 05/15/22   Almyra Deforest, PA  traZODone (DESYREL) 50 MG tablet Take 50 mg by mouth at bedtime.  05/10/22   [provider]    Family History Family History  Problem Relation Age of Onset   Diabetes Mother    Hypertension Mother    Cancer Father    Diabetes Father    Hypertension Father     Social History Social History   Tobacco Use   Smoking status: Every Day    Types: Cigarettes   Smokeless tobacco: Never   Tobacco comments:    2 cigs per day  Vaping Use   Vaping Use: Never used  Substance Use Topics   Alcohol use: Not Currently   Drug use: Yes    Types: Marijuana    Comment: occasionally     Allergies   Shrimp (diagnostic)   Review of Systems Review of Systems  Per HPI  Physical Exam Triage Vital Signs ED Triage Vitals  Enc Vitals Group     BP 05/25/22 0823 115/70     Pulse Rate 05/25/22 0823 71     Resp 05/25/22 0823 18     Temp 05/25/22 0823 98.7 F (37.1 C)     Temp Source 05/25/22 0823 Oral     SpO2 05/25/22 0823 97 %     Weight --      Height --      Head Circumference --  Peak Flow --      Pain Score 05/25/22 0824 0     Pain Loc --      Pain Edu? --      Excl. in GC? --    No data found.  Updated Vital Signs BP 115/70 (BP Location: Right Arm)   Pulse 71   Temp 98.7 F (37.1 C) (Oral)   Resp 18   SpO2 97%     Physical Exam Vitals and nursing note reviewed.  Constitutional:      General: She is not in acute distress.    Appearance: Normal appearance. She is not toxic-appearing.     Comments: Speaks in full sentences  HENT:     Mouth/Throat:     Mouth: Mucous membranes are moist. No angioedema.     Pharynx: Oropharynx is clear. Uvula midline. No pharyngeal swelling or uvula swelling.     Comments: Minor swelling of lower lip. No swelling noted to tongue, face, pharynx Eyes:     Conjunctiva/sclera: Conjunctivae normal.  Cardiovascular:     Rate and Rhythm: Normal rate and regular rhythm.     Heart sounds: Normal heart sounds.  Pulmonary:     Effort: Pulmonary effort is normal.     Breath sounds: Normal  breath sounds.  Abdominal:     Tenderness: There is no abdominal tenderness.  Skin:    General: Skin is warm and dry.     Findings: No rash.  Neurological:     Mental Status: She is alert and oriented to person, place, and time.     UC Treatments / Results  Labs (all labs ordered are listed, but only abnormal results are displayed) Labs Reviewed - No data to display  EKG   Radiology No results found.  Procedures Procedures (including critical care time)  Medications Ordered in UC Medications  diphenhydrAMINE (BENADRYL) capsule 25 mg (25 mg Oral Given 05/25/22 0856)  famotidine (PEPCID) tablet 20 mg (20 mg Oral Given 05/25/22 0856)  methylPREDNISolone sodium succinate (SOLU-MEDROL) 125 mg/2 mL injection 60 mg (0.96 mg Intramuscular Given 05/25/22 0855)    Initial Impression / Assessment and Plan / UC Course  I have reviewed the triage vital signs and the nursing notes.  Pertinent labs & imaging results that were available during my care of the patient were reviewed by me and considered in my medical decision making (see chart for details).  Patient in no acute distress, speaking in full sentences, lungs are clear. Added shrimp to patient allergy list Solu-Medrol IM given with oral Pepcid and Benadryl Symptoms improved. Recommend antihistamine regimen over the next few days, daily 10 mg Zyrtec, nightly 20 mg Pepcid Discussed follow-up with primary care regarding allergy testing  ED precautions discussed.  Patient agrees to plan  Final Clinical Impressions(s) / UC Diagnoses   Final diagnoses:  Allergic reaction to food, initial encounter  Allergy to shrimp  Hives     Discharge Instructions      I recommend a daily regimen of Zyrtec in the morning and Pepcid at night. Try this for the next week or so. These medicines can control itching.  Please follow up with your primary care provider if you wish to have allergy testing performed. I recommend avoiding shrimp  and other shellfish in the meantime.  If you develop difficulty breathing please go directly to the emergency department.     ED Prescriptions     Medication Sig Dispense Auth. Provider   cetirizine (ZYRTEC ALLERGY) 10 MG tablet  Take 1 tablet (10 mg total) by mouth daily. 30 tablet Onalee Steinbach, PA-C   famotidine (PEPCID) 20 MG tablet Take 1 tablet (20 mg total) by mouth at bedtime. 30 tablet Masyn Fullam, Lurena Joiner, PA-C      PDMP not reviewed this encounter.   Marlow Baars, [place] 05/25/22 8315

## 2022-05-29 DIAGNOSIS — F4310 Post-traumatic stress disorder, unspecified: Secondary | ICD-10-CM | POA: Diagnosis not present

## 2022-05-29 DIAGNOSIS — F411 Generalized anxiety disorder: Secondary | ICD-10-CM | POA: Diagnosis not present

## 2022-05-29 DIAGNOSIS — F331 Major depressive disorder, recurrent, moderate: Secondary | ICD-10-CM | POA: Diagnosis not present

## 2022-05-29 DIAGNOSIS — F410 Panic disorder [episodic paroxysmal anxiety] without agoraphobia: Secondary | ICD-10-CM | POA: Diagnosis not present

## 2022-06-02 DIAGNOSIS — F4323 Adjustment disorder with mixed anxiety and depressed mood: Secondary | ICD-10-CM | POA: Diagnosis not present

## 2022-06-09 DIAGNOSIS — F4323 Adjustment disorder with mixed anxiety and depressed mood: Secondary | ICD-10-CM | POA: Diagnosis not present

## 2022-06-23 DIAGNOSIS — F4323 Adjustment disorder with mixed anxiety and depressed mood: Secondary | ICD-10-CM | POA: Diagnosis not present

## 2022-07-07 DIAGNOSIS — F4323 Adjustment disorder with mixed anxiety and depressed mood: Secondary | ICD-10-CM | POA: Diagnosis not present

## 2022-07-14 ENCOUNTER — Encounter: Payer: Self-pay | Admitting: Family Medicine

## 2022-07-14 ENCOUNTER — Telehealth: Payer: Medicaid Other | Admitting: Family Medicine

## 2022-07-14 DIAGNOSIS — J069 Acute upper respiratory infection, unspecified: Secondary | ICD-10-CM

## 2022-07-14 DIAGNOSIS — F4323 Adjustment disorder with mixed anxiety and depressed mood: Secondary | ICD-10-CM | POA: Diagnosis not present

## 2022-07-14 MED ORDER — FLUTICASONE PROPIONATE 50 MCG/ACT NA SUSP: 2.0000 | Freq: Every day | NASAL | 0 refills | Status: DC

## 2022-07-14 MED ORDER — BENZONATATE 100 MG PO CAPS: 100.0000 mg | ORAL_CAPSULE | Freq: Two times a day (BID) | ORAL | 0 refills | Status: DC | PRN

## 2022-07-14 MED ORDER — PSEUDOEPH-BROMPHEN-DM 30-2-10 MG/5ML PO SYRP: 5.0000 mL | ORAL_SOLUTION | Freq: Four times a day (QID) | ORAL | 0 refills | Status: DC | PRN

## 2022-07-14 NOTE — Progress Notes (Signed)
E-Visit for Upper Respiratory Infection   We are sorry you are not feeling well.  Here is how we plan to help!  Based on what you have shared with me, it looks like you may have a viral upper respiratory infection.  Upper respiratory infections are caused by a large number of viruses; however, rhinovirus is the most common cause.   Symptoms vary from person to person, with common symptoms including sore throat, cough, fatigue or lack of energy and feeling of general discomfort.  A low-grade fever of up to 100.4 may present, but is often uncommon.  Symptoms vary however, and are closely related to a person's age or underlying illnesses.  The most common symptoms associated with an upper respiratory infection are nasal discharge or congestion, cough, sneezing, headache and pressure in the ears and face.  These symptoms usually persist for about 3 to 10 days, but can last up to 2 weeks.  It is important to know that upper respiratory infections do not cause serious illness or complications in most cases.    Upper respiratory infections can be transmitted from person to person, with the most common method of transmission being a person's hands.  The virus is able to live on the skin and can infect other persons for up to 2 hours after direct contact.  Also, these can be transmitted when someone coughs or sneezes; thus, it is important to cover the mouth to reduce this risk.  To keep the spread of the illness at bay, good hand hygiene is very important.  This is an infection that is most likely caused by a virus. There are no specific treatments other than to help you with the symptoms until the infection runs its course.  We are sorry you are not feeling well.  Here is how we plan to help!   For nasal congestion, you may use an oral decongestants such as Mucinex D or if you have glaucoma or high blood pressure use plain Mucinex.  Saline nasal spray or nasal drops can help and can safely be used as often as  needed for congestion.  For your congestion, I have prescribed Fluticasone nasal spray one spray in each nostril twice a day I would still try to use these sprays it can take several days to a week to fully kick in and start to work.  If you do not have a history of heart disease, hypertension, diabetes or thyroid disease, prostate/bladder issues or glaucoma, you may also use Sudafed to treat nasal congestion.  It is highly recommended that you consult with a pharmacist or your primary care physician to ensure this medication is safe for you to take.     If you have a cough, you may use cough suppressants such as Delsym and Robitussin.  If you have glaucoma or high blood pressure, you can also use Coricidin HBP.   For cough I have prescribed for you A prescription cough medication called Tessalon Perles 100 mg. You may take 1-2 capsules every 8 hours as needed for cough I will order a cough syrup as well for you to take that will help cough and sinus congestion.  If you have a sore or scratchy throat, use a saltwater gargle-  to  teaspoon of salt dissolved in a 4-ounce to 8-ounce glass of warm water.  Gargle the solution for approximately 15-30 seconds and then spit.  It is important not to swallow the solution.  You can also use throat lozenges/cough drops  and Chloraseptic spray to help with throat pain or discomfort.  Warm or cold liquids can also be helpful in relieving throat pain.  For headache, pain or general discomfort, you can use Ibuprofen or Tylenol as directed.   Some authorities believe that zinc sprays or the use of Echinacea may shorten the course of your symptoms.   HOME CARE Only take medications as instructed by your medical team. Be sure to drink plenty of fluids. Water is fine as well as fruit juices, sodas and electrolyte beverages. You may want to stay away from caffeine or alcohol. If you are nauseated, try taking small sips of liquids. How do you know if you are getting  enough fluid? Your urine should be a pale yellow or almost colorless. Get rest. Taking a steamy shower or using a humidifier may help nasal congestion and ease sore throat pain. You can place a towel over your head and breathe in the steam from hot water coming from a faucet. Using a saline nasal spray works much the same way. Cough drops, hard candies and sore throat lozenges may ease your cough. Avoid close contacts especially the very young and the elderly Cover your mouth if you cough or sneeze Always remember to wash your hands.   GET HELP RIGHT AWAY IF: You develop worsening fever. If your symptoms do not improve within 10 days You develop yellow or green discharge from your nose over 3 days. You have coughing fits You develop a severe head ache or visual changes. You develop shortness of breath, difficulty breathing or start having chest pain Your symptoms persist after you have completed your treatment plan  MAKE SURE YOU  Understand these instructions. Will watch your condition. Will get help right away if you are not doing well or get worse.  Thank you for choosing an e-visit.  Your e-visit answers were reviewed by a board certified advanced clinical practitioner to complete your personal care plan. Depending upon the condition, your plan could have included both over the counter or prescription medications.  Please review your pharmacy choice. Make sure the pharmacy is open so you can pick up prescription now. If there is a problem, you may contact your provider through [employer] and have the prescription routed to another pharmacy.  Your safety is important to [place]. If you have drug allergies check your prescription carefully.   For the next 24 hours you can use MyChart to ask questions about today's visit, request a non-urgent call back, or ask for a work or school excuse. You will get an email in the next two days asking about your experience. I hope that your  e-visit has been valuable and will speed your recovery.    I provided 5 minutes of non face-to-face time during this encounter for chart review, medication and order placement, as well as and documentation.

## 2022-07-16 DIAGNOSIS — F411 Generalized anxiety disorder: Secondary | ICD-10-CM | POA: Diagnosis not present

## 2022-07-16 DIAGNOSIS — F331 Major depressive disorder, recurrent, moderate: Secondary | ICD-10-CM | POA: Diagnosis not present

## 2022-07-16 DIAGNOSIS — F410 Panic disorder [episodic paroxysmal anxiety] without agoraphobia: Secondary | ICD-10-CM | POA: Diagnosis not present

## 2022-07-16 DIAGNOSIS — F4310 Post-traumatic stress disorder, unspecified: Secondary | ICD-10-CM | POA: Diagnosis not present

## 2022-07-21 DIAGNOSIS — F4323 Adjustment disorder with mixed anxiety and depressed mood: Secondary | ICD-10-CM | POA: Diagnosis not present

## 2022-07-24 DIAGNOSIS — F4310 Post-traumatic stress disorder, unspecified: Secondary | ICD-10-CM | POA: Diagnosis not present

## 2022-07-24 DIAGNOSIS — F410 Panic disorder [episodic paroxysmal anxiety] without agoraphobia: Secondary | ICD-10-CM | POA: Diagnosis not present

## 2022-07-24 DIAGNOSIS — F331 Major depressive disorder, recurrent, moderate: Secondary | ICD-10-CM | POA: Diagnosis not present

## 2022-07-24 DIAGNOSIS — F411 Generalized anxiety disorder: Secondary | ICD-10-CM | POA: Diagnosis not present

## 2022-07-28 DIAGNOSIS — F4323 Adjustment disorder with mixed anxiety and depressed mood: Secondary | ICD-10-CM | POA: Diagnosis not present

## 2022-08-07 DIAGNOSIS — F331 Major depressive disorder, recurrent, moderate: Secondary | ICD-10-CM | POA: Diagnosis not present

## 2022-08-07 DIAGNOSIS — F411 Generalized anxiety disorder: Secondary | ICD-10-CM | POA: Diagnosis not present

## 2022-08-07 DIAGNOSIS — F4310 Post-traumatic stress disorder, unspecified: Secondary | ICD-10-CM | POA: Diagnosis not present

## 2022-08-07 DIAGNOSIS — F410 Panic disorder [episodic paroxysmal anxiety] without agoraphobia: Secondary | ICD-10-CM | POA: Diagnosis not present

## 2022-08-11 DIAGNOSIS — F4323 Adjustment disorder with mixed anxiety and depressed mood: Secondary | ICD-10-CM | POA: Diagnosis not present

## 2022-08-25 DIAGNOSIS — F4323 Adjustment disorder with mixed anxiety and depressed mood: Secondary | ICD-10-CM | POA: Diagnosis not present

## 2022-09-01 DIAGNOSIS — F4323 Adjustment disorder with mixed anxiety and depressed mood: Secondary | ICD-10-CM | POA: Diagnosis not present

## 2022-09-04 DIAGNOSIS — F411 Generalized anxiety disorder: Secondary | ICD-10-CM | POA: Diagnosis not present

## 2022-09-04 DIAGNOSIS — F410 Panic disorder [episodic paroxysmal anxiety] without agoraphobia: Secondary | ICD-10-CM | POA: Diagnosis not present

## 2022-09-04 DIAGNOSIS — F331 Major depressive disorder, recurrent, moderate: Secondary | ICD-10-CM | POA: Diagnosis not present

## 2022-09-04 DIAGNOSIS — F4310 Post-traumatic stress disorder, unspecified: Secondary | ICD-10-CM | POA: Diagnosis not present

## 2022-09-08 DIAGNOSIS — F4323 Adjustment disorder with mixed anxiety and depressed mood: Secondary | ICD-10-CM | POA: Diagnosis not present

## 2022-11-13 DIAGNOSIS — M21612 Bunion of left foot: Secondary | ICD-10-CM | POA: Diagnosis not present

## 2022-11-13 DIAGNOSIS — Z131 Encounter for screening for diabetes mellitus: Secondary | ICD-10-CM | POA: Diagnosis not present

## 2022-11-13 DIAGNOSIS — E782 Mixed hyperlipidemia: Secondary | ICD-10-CM | POA: Diagnosis not present

## 2022-11-16 DIAGNOSIS — E876 Hypokalemia: Secondary | ICD-10-CM | POA: Diagnosis not present

## 2022-11-16 DIAGNOSIS — R718 Other abnormality of red blood cells: Secondary | ICD-10-CM | POA: Diagnosis not present

## 2022-11-16 DIAGNOSIS — E782 Mixed hyperlipidemia: Secondary | ICD-10-CM | POA: Diagnosis not present

## 2022-11-16 DIAGNOSIS — E559 Vitamin D deficiency, unspecified: Secondary | ICD-10-CM | POA: Diagnosis not present

## 2022-12-04 DIAGNOSIS — F331 Major depressive disorder, recurrent, moderate: Secondary | ICD-10-CM | POA: Diagnosis not present

## 2022-12-04 DIAGNOSIS — F4310 Post-traumatic stress disorder, unspecified: Secondary | ICD-10-CM | POA: Diagnosis not present

## 2022-12-04 DIAGNOSIS — F411 Generalized anxiety disorder: Secondary | ICD-10-CM | POA: Diagnosis not present

## 2022-12-04 DIAGNOSIS — F410 Panic disorder [episodic paroxysmal anxiety] without agoraphobia: Secondary | ICD-10-CM | POA: Diagnosis not present

## 2022-12-29 ENCOUNTER — Ambulatory Visit: Payer: Medicaid Other | Admitting: Physician Assistant

## 2022-12-29 ENCOUNTER — Ambulatory Visit
Admission: RE | Admit: 2022-12-29 | Discharge: 2022-12-29 | Disposition: A | Discharge status: Home / Self Care | Payer: Medicaid Other | Source: Ambulatory Visit | Attending: Urgent Care | Admitting: Urgent Care

## 2022-12-29 VITALS — BP 110/72 | HR 63 | Temp 98.6°F | Resp 17

## 2022-12-29 DIAGNOSIS — S0012XA Contusion of left eyelid and periocular area, initial encounter: Secondary | ICD-10-CM | POA: Diagnosis not present

## 2022-12-29 MED ORDER — DICLOFENAC SODIUM 0.1 % OP SOLN: 1.0000 [drp] | Freq: Four times a day (QID) | OPHTHALMIC | 0 refills | Status: DC

## 2022-12-29 NOTE — ED Triage Notes (Signed)
Pt presents with c/o lt eye pain. Pt states she went for a walk and something go into her eye. She woke up this morning with eye redness and swelling. Denies itching.

## 2022-12-29 NOTE — ED Provider Notes (Signed)
[facility]  Note:  This document was prepared using Conservation officer, historic buildings and may include unintentional dictation errors.  MRN: [id number] DOB: [birth year]  Subjective:   Kelsey Lee is a 47 y.o. female presenting for 1 day history of left upper eyelid pain.  Patient was inadvertently hit with something to the left upper eyelid while taking a walk yesterday.  She did take some ibuprofen yesterday which helped.  This morning when she woke up she had significant swelling.  Has improved throughout the day and its much better but wanted to be evaluated.  No current facility-administered medications for this encounter.  Current Outpatient Medications:    benzonatate (TESSALON) 100 MG capsule, Take 1 capsule (100 mg total) by mouth 2 (two) times daily as needed for cough., Disp: 20 capsule, Rfl: 0   brompheniramine-pseudoephedrine-DM 30-2-10 MG/5ML syrup, Take 5 mLs by mouth 4 (four) times daily as needed., Disp: 120 mL, Rfl: 0   cetirizine (ZYRTEC ALLERGY) 10 MG tablet, Take 1 tablet (10 mg total) by mouth daily., Disp: 30 tablet, Rfl: 2   famotidine (PEPCID) 20 MG tablet, Take 1 tablet (20 mg total) by mouth at bedtime., Disp: 30 tablet, Rfl: 0   fluticasone (FLONASE) 50 MCG/ACT nasal spray, Place 2 sprays into both nostrils daily., Disp: 16 g, Rfl: 0   hydrOXYzine (ATARAX) 25 MG tablet, Take 25 mg by mouth as needed., Disp: , Rfl:    ketoconazole (NIZORAL) 2 % cream, Apply 1 Application topically daily., Disp: 60 g, Rfl: 1   propranolol (INDERAL) 10 MG tablet, Take 1 tablet (10 mg total) by mouth daily., Disp: 90 tablet, Rfl: 3   traZODone (DESYREL) 50 MG tablet, Take 50 mg by mouth at bedtime., Disp: , Rfl:    Allergies  Allergen Reactions   Shrimp (Diagnostic) Itching and Swelling    Past Medical History:  Diagnosis Date   Anxiety    Depression    Heart murmur    Low vitamin D level    Psoriasis    PTSD (post-traumatic stress  disorder)      Past Surgical History:  Procedure Laterality Date   CESAREAN SECTION     ENDOMETRIAL ABLATION     TUBAL LIGATION      Family History  Problem Relation Age of Onset   Diabetes Mother    Hypertension Mother    Cancer Father    Diabetes Father    Hypertension Father     Social History   Tobacco Use   Smoking status: Every Day    Types: Cigarettes   Smokeless tobacco: Never   Tobacco comments:    2 cigs per day  Vaping Use   Vaping Use: Never used  Substance Use Topics   Alcohol use: Not Currently   Drug use: Yes    Types: Marijuana    Comment: occasionally    ROS   Objective:   Vitals: BP 110/72 (BP Location: Left Arm)   Pulse 63   Temp 98.6 F (37 C) (Oral)   Resp 17   SpO2 94%   Physical Exam Constitutional:      General: She is not in acute distress.    Appearance: Normal appearance. She is well-developed. She is not ill-appearing, toxic-appearing or diaphoretic.  HENT:     Head: Normocephalic and atraumatic.     Nose: Nose normal.     Mouth/Throat:     Mouth: Mucous membranes are moist.  Eyes:     General:  Lids are everted, no foreign bodies appreciated. Vision grossly intact. No scleral icterus.       Right eye: No foreign body, discharge or hordeolum.        Left eye: No foreign body, discharge or hordeolum.     Extraocular Movements: Extraocular movements intact.     Right eye: Normal extraocular motion.     Left eye: Normal extraocular motion and no nystagmus.     Conjunctiva/sclera:     Right eye: Right conjunctiva is not injected. No chemosis, exudate or hemorrhage.    Left eye: Left conjunctiva is not injected. No chemosis, exudate or hemorrhage.  Cardiovascular:     Rate and Rhythm: Normal rate.  Pulmonary:     Effort: Pulmonary effort is normal.  Skin:    General: Skin is warm and dry.  Neurological:     General: No focal deficit present.     Mental Status: She is alert and oriented to person, place, and time.   Psychiatric:        Mood and Affect: Mood normal.        Behavior: Behavior normal.    Eye Exam: Eyelids everted and swept for foreign body. The eye was stained with fluorescein. Examination under woods lamp does not reveal a foreign body or area of increased stain uptake. The eye was then irrigated copiously with saline.   Assessment and Plan :   PDMP not reviewed this encounter.  1. Contusion of left eyelid, initial encounter    Suspect contusion of the left eyelid that is mild in nature.  Use diclofenac ophthalmic solution as needed, ibuprofen orally as needed.  Counseled patient on potential for adverse effects with medications prescribed/recommended today, ER and return-to-clinic precautions discussed, patient verbalized understanding.    Wallis Bamberg, [place] 12/29/22 [id number]

## 2023-01-29 ENCOUNTER — Ambulatory Visit: Payer: Medicaid Other | Admitting: Physician Assistant

## 2023-01-29 DIAGNOSIS — F411 Generalized anxiety disorder: Secondary | ICD-10-CM | POA: Diagnosis not present

## 2023-01-29 DIAGNOSIS — F410 Panic disorder [episodic paroxysmal anxiety] without agoraphobia: Secondary | ICD-10-CM | POA: Diagnosis not present

## 2023-01-29 DIAGNOSIS — F331 Major depressive disorder, recurrent, moderate: Secondary | ICD-10-CM | POA: Diagnosis not present

## 2023-01-29 NOTE — Progress Notes (Unsigned)
  Cardiology Office Note:  .   Date:  01/29/2023  ID:  Kelsey Lee, DOB 1976-01-07, MRN [id number] PCP: Department, [facility] Providers Cardiologist:  Thomasene Ripple, DO { Click to update primary MD,subspecialty MD or APP then REFRESH:1}   History of Present Illness: .   Kelsey Lee is a 47 y.o. female with a hx of traumatic stress disorder, depression/anxiety and history of chest pain.  Patient initially lived in [place] back in 2018 where she had episodes of chest discomfort.  She had a stress test there, this led to a cardiac catheterization that did not show any evidence of CAD.  Patient was recently seen by Dr. Servando Salina again in March 2023 at which time she was experiencing some chest discomfort.  Heart monitor shows minimal heart rate 51, maximal heart rate 150, average heart rate 89.  5 episodes of supraventricular tachycardia with the fastest interval lasting 6 beats, longest interval lasting 9 beats.  Rare PVCs.  Echocardiogram showed EF 60 to 65%, normal RV, no significant valve issue.  Lab work shows normal thyroid level, normal sed rate, borderline elevated CRP.   ROS: ***  Studies Reviewed: .        *** Risk Assessment/Calculations:   {Does this patient have ATRIAL FIBRILLATION?:[id number]} No BP recorded.  {Refresh Note OR Click here to enter BP  :1}***       Physical Exam:   VS:  There were no vitals taken for this visit.   Wt Readings from Last 3 Encounters:  05/15/22 156 lb 3.2 oz (70.9 kg)  11/28/21 158 lb (71.7 kg)  10/17/21 168 lb 9.6 oz (76.5 kg)    GEN: Well nourished, well developed in no acute distress NECK: No JVD; No carotid bruits CARDIAC: ***RRR, no murmurs, rubs, gallops RESPIRATORY:  Clear to auscultation without rales, wheezing or rhonchi  ABDOMEN: Soft, non-tender, non-distended EXTREMITIES:  No edema; No deformity   ASSESSMENT AND PLAN: .   ***    {Are you ordering a CV Procedure (e.g. stress test,  cath, DCCV, TEE, etc)?   Press F2        :[id number]}  Dispo: ***  Signed, Azalee Course, PA

## 2023-02-10 ENCOUNTER — Ambulatory Visit: Payer: Medicaid Other | Admitting: Physician Assistant

## 2023-02-19 DIAGNOSIS — F411 Generalized anxiety disorder: Secondary | ICD-10-CM | POA: Diagnosis not present

## 2023-02-19 DIAGNOSIS — F331 Major depressive disorder, recurrent, moderate: Secondary | ICD-10-CM | POA: Diagnosis not present

## 2023-02-19 DIAGNOSIS — F410 Panic disorder [episodic paroxysmal anxiety] without agoraphobia: Secondary | ICD-10-CM | POA: Diagnosis not present

## 2023-03-05 DIAGNOSIS — F4310 Post-traumatic stress disorder, unspecified: Secondary | ICD-10-CM | POA: Diagnosis not present

## 2023-03-05 DIAGNOSIS — F410 Panic disorder [episodic paroxysmal anxiety] without agoraphobia: Secondary | ICD-10-CM | POA: Diagnosis not present

## 2023-03-05 DIAGNOSIS — F331 Major depressive disorder, recurrent, moderate: Secondary | ICD-10-CM | POA: Diagnosis not present

## 2023-03-05 DIAGNOSIS — F411 Generalized anxiety disorder: Secondary | ICD-10-CM | POA: Diagnosis not present

## 2023-03-24 ENCOUNTER — Ambulatory Visit: Payer: Medicaid Other | Admitting: Physician Assistant

## 2023-04-21 ENCOUNTER — Other Ambulatory Visit: Payer: Self-pay | Admitting: Physician Assistant

## 2023-04-21 DIAGNOSIS — Z1231 Encounter for screening mammogram for malignant neoplasm of breast: Secondary | ICD-10-CM

## 2023-05-07 ENCOUNTER — Encounter: Payer: Self-pay | Admitting: Cardiology

## 2023-05-07 ENCOUNTER — Ambulatory Visit: Payer: Medicaid Other | Attending: Physician Assistant | Admitting: Cardiology

## 2023-05-07 ENCOUNTER — Ambulatory Visit: Payer: Medicaid Other | Admitting: Physician Assistant

## 2023-05-07 VITALS — BP 100/68 | HR 71 | Ht 60.0 in | Wt 148.0 lb

## 2023-05-07 DIAGNOSIS — R0789 Other chest pain: Secondary | ICD-10-CM

## 2023-05-07 DIAGNOSIS — R7303 Prediabetes: Secondary | ICD-10-CM

## 2023-05-07 DIAGNOSIS — I471 Supraventricular tachycardia, unspecified: Secondary | ICD-10-CM | POA: Diagnosis not present

## 2023-05-07 MED ORDER — PROPRANOLOL HCL 10 MG PO TABS: 10.0000 mg | ORAL_TABLET | Freq: Every day | ORAL | 3 refills | Status: AC

## 2023-05-07 NOTE — Patient Instructions (Signed)
Medication Instructions:  - refilled your propanolol 10mg , once daily    *If you need a refill on your cardiac medications before your next appointment, please call your pharmacy*    Follow-Up: At [facility], you and your health needs are our priority.  As part of our continuing mission to provide you with exceptional heart care, we have created designated Provider Care Teams.  These Care Teams include your primary Cardiologist (physician) and Advanced Practice Providers (APPs -  Physician Assistants and Nurse Practitioners) who all work together to provide you with the care you need, when you need it.  We recommend signing up for the patient portal called "MyChart".  Sign up information is provided on this After Visit Summary.  MyChart is used to connect with patients for Virtual Visits (Telemedicine).  Patients are able to view lab/test results, encounter notes, upcoming appointments, etc.  Non-urgent messages can be sent to your provider as well.   To learn more about what you can do with MyChart, go to [email or web address].    Your next appointment:   1 year(s)  The format for your next appointment:   In Person  Provider:   Thomasene Ripple, DO

## 2023-05-07 NOTE — Progress Notes (Unsigned)
Cardiology Office Note:    Date:  05/10/2023   ID:  Kelsey Lee, DOB [birth year], MRN [id number]  PCP:  Department, [facility]  Cardiologist:  Thomasene Ripple, DO  Electrophysiologist:  None   Referring MD: Department, Loann Quill*    History of Present Illness:    Kelsey Lee is a 47 y.o. female with a history of supraventricular tachycardia (SVT), presents for a follow-up visit. She reports an episode of severe chest pain in July, which she describes as feeling like "something was sitting on her chest." The pain was so severe that she passed out. She attributes this episode to extreme stress related to a family situation involving one of her daughters. Since that time, she has been feeling well and has not experienced any similar episodes.  The patient also reports experiencing palpitations, which she attributes to running out of her propranolol medication. She notes that the palpitations are not always associated with pain and seem to be related to stress. She has been taking propranolol once daily and reports that it has been effective in preventing the sharp chest pain she used to experience due to her SVT. She has not felt the need to increase the dose to twice daily.  In addition, the patient reports that she has been diagnosed as prediabetic based on recent blood work. She is also taking vitamin D supplements due to a deficiency.  Past Medical History:  Diagnosis Date   Anxiety    Depression    Heart murmur    Low vitamin D level    Psoriasis    PTSD (post-traumatic stress disorder)     Past Surgical History:  Procedure Laterality Date   CESAREAN SECTION     ENDOMETRIAL ABLATION     TUBAL LIGATION      Current Medications: Current Meds  Medication Sig   buPROPion (WELLBUTRIN XL) 300 MG 24 hr tablet Take 300 mg by mouth daily.   CHANTIX 1 MG tablet Take 1 mg by mouth 2 (two) times daily.   Cholecalciferol (VITAMIN D) 125 MCG (5000 UT)  CAPS daily.   hydrOXYzine (ATARAX) 25 MG tablet Take 25 mg by mouth as needed.   [DISCONTINUED] propranolol (INDERAL) 10 MG tablet Take 1 tablet (10 mg total) by mouth daily.     Allergies:   Shrimp (diagnostic)   Social History   Socioeconomic History   Marital status: Media planner    Spouse name: Not on file   Number of children: Not on file   Years of education: Not on file   Highest education level: Not on file  Occupational History   Not on file  Tobacco Use   Smoking status: Every Day    Types: Cigarettes   Smokeless tobacco: Never   Tobacco comments:    2 cigs per day  Vaping Use   Vaping status: Never Used  Substance and Sexual Activity   Alcohol use: Not Currently   Drug use: Yes    Types: Marijuana    Comment: occasionally   Sexual activity: Not on file  Other Topics Concern   Not on file  Social History Narrative   Not on file   Social Determinants of Health   Financial Resource Strain: Low Risk  (12/05/2021)   Overall Financial Resource Strain (CARDIA)    Difficulty of Paying Living Expenses: Not very hard  Food Insecurity: No Food Insecurity (12/05/2021)   Hunger Vital Sign    Worried About Running Out of Food in  the Last Year: Never true    Ran Out of Food in the Last Year: Never true  Transportation Needs: No Transportation Needs (12/05/2021)   PRAPARE - Administrator, Civil Service (Medical): No    Lack of Transportation (Non-Medical): No  Physical Activity: Not on file  Stress: Not on file  Social Connections: Not on file     Family History: The patient's family history includes Cancer in her father; Diabetes in her father and mother; Hypertension in her father and mother.  ROS:   Review of Systems  Constitution: Negative for decreased appetite, fever and weight gain.  HENT: Negative for congestion, ear discharge, hoarse voice and sore throat.   Eyes: Negative for discharge, redness, vision loss in right eye and visual halos.   Cardiovascular: Negative for chest pain, dyspnea on exertion, leg swelling, orthopnea and palpitations.  Respiratory: Negative for cough, hemoptysis, shortness of breath and snoring.   Endocrine: Negative for heat intolerance and polyphagia.  Hematologic/Lymphatic: Negative for bleeding problem. Does not bruise/bleed easily.  Skin: Negative for flushing, nail changes, rash and suspicious lesions.  Musculoskeletal: Negative for arthritis, joint pain, muscle cramps, myalgias, neck pain and stiffness.  Gastrointestinal: Negative for abdominal pain, bowel incontinence, diarrhea and excessive appetite.  Genitourinary: Negative for decreased libido, genital sores and incomplete emptying.  Neurological: Negative for brief paralysis, focal weakness, headaches and loss of balance.  Psychiatric/Behavioral: Negative for altered mental status, depression and suicidal ideas.  Allergic/Immunologic: Negative for HIV exposure and persistent infections.    EKGs/Labs/Other Studies Reviewed:    The following studies were reviewed today:   EKG:  The ekg ordered today demonstrates sinus rhythm   Recent Labs: No results found for requested labs within last 365 days.  Recent Lipid Panel No results found for: "CHOL", "TRIG", "HDL", "CHOLHDL", "VLDL", "LDLCALC", "LDLDIRECT"  Physical Exam:    VS:  BP 100/68 (BP Location: Right Arm, Patient Position: Sitting, Cuff Size: Normal)   Pulse 71   Ht 5' (1.524 m)   Wt 148 lb (67.1 kg)   SpO2 97%   BMI 28.90 kg/m     Wt Readings from Last 3 Encounters:  05/07/23 148 lb (67.1 kg)  05/15/22 156 lb 3.2 oz (70.9 kg)  11/28/21 158 lb (71.7 kg)     GEN: Well nourished, well developed in no acute distress HEENT: Normal NECK: No JVD; No carotid bruits LYMPHATICS: No lymphadenopathy CARDIAC: S1S2 noted,RRR, no murmurs, rubs, gallops RESPIRATORY:  Clear to auscultation without rales, wheezing or rhonchi  ABDOMEN: Soft, non-tender, non-distended, +bowel sounds,  no guarding. EXTREMITIES: No edema, No cyanosis, no clubbing MUSCULOSKELETAL:  No deformity  SKIN: Warm and dry NEUROLOGIC:  Alert and oriented x 3, non-focal PSYCHIATRIC:  Normal affect, good insight  ASSESSMENT:    1. Atypical chest pain   2. PSVT (paroxysmal supraventricular tachycardia)   3. Prediabetes    PLAN:    Supraventricular Tachycardia (SVT) Patient reports improvement in symptoms with Propranolol. She experiences palpitations and occasional chest pain, particularly during periods of stress. She ran out of Propranolol and has been taking it once daily when available. -Refill Propranolol prescription. -Continue current regimen of Propranolol once daily, with the option to take a second dose as needed.  Chest Pain Patient experienced severe chest pain during a stressful event in July, which resolved spontaneously. No recurrence since then. -If recurrent chest pain, consider coronary CT scan to rule out other causes.  Prediabetes Recently diagnosed by the [facility]. No  other abnormal lab results reported. -Note prediabetes in patient's chart.  Follow-up Annual visits with interim communication via MyChart as needed. Propranolol prescription refills to be provided for a year   The patient is in agreement with the above plan. The patient left the office in stable condition.  The patient will follow up in 1 year or sooner if needed   Medication Adjustments/Labs and Tests Ordered: Current medicines are reviewed at length with the patient today.  Concerns regarding medicines are outlined above.  Orders Placed This Encounter  Procedures   EKG 12-Lead   Meds ordered this encounter  Medications   propranolol (INDERAL) 10 MG tablet    Sig: Take 1 tablet (10 mg total) by mouth daily.    Dispense:  90 tablet    Refill:  3    Patient Instructions  Medication Instructions:  - refilled your propanolol 10mg , once daily    *If you need a refill on  your cardiac medications before your next appointment, please call your pharmacy*    Follow-Up: At [facility], you and your health needs are our priority.  As part of our continuing mission to provide you with exceptional heart care, we have created designated Provider Care Teams.  These Care Teams include your primary Cardiologist (physician) and Advanced Practice Providers (APPs -  Physician Assistants and Nurse Practitioners) who all work together to provide you with the care you need, when you need it.  We recommend signing up for the patient portal called "MyChart".  Sign up information is provided on this After Visit Summary.  MyChart is used to connect with patients for Virtual Visits (Telemedicine).  Patients are able to view lab/test results, encounter notes, upcoming appointments, etc.  Non-urgent messages can be sent to your provider as well.   To learn more about what you can do with MyChart, go to [email or web address].    Your next appointment:   1 year(s)  The format for your next appointment:   In Person  Provider:   Thomasene Ripple, DO       Adopting a Healthy Lifestyle.  Know what a healthy weight is for you (roughly BMI <25) and aim to maintain this   Aim for 7+ servings of fruits and vegetables daily   65-80+ fluid ounces of water or unsweet tea for healthy kidneys   Limit to max 1 drink of alcohol per day; avoid smoking/tobacco   Limit animal fats in diet for cholesterol and heart health - choose grass fed whenever available   Avoid highly processed foods, and foods high in saturated/trans fats   Aim for low stress - take time to unwind and care for your mental health   Aim for 150 min of moderate intensity exercise weekly for heart health, and weights twice weekly for bone health   Aim for 7-9 hours of sleep daily   When it comes to diets, agreement about the perfect plan isnt easy to find, even among the experts. Experts at the [facility] of  [employer] developed an idea known as the Healthy Eating Plate. Just imagine a plate divided into logical, healthy portions.   The emphasis is on diet quality:   Load up on vegetables and fruits - one-half of your plate: Aim for color and variety, and remember that potatoes dont count.   Go for whole grains - one-quarter of your plate: Whole wheat, barley, wheat berries, quinoa, oats, brown rice, and foods made with them. If you want pasta, go with  whole wheat pasta.   Protein power - one-quarter of your plate: Fish, chicken, beans, and nuts are all healthy, versatile protein sources. Limit red meat.   The diet, however, does go beyond the plate, offering a few other suggestions.   Use healthy plant oils, such as olive, canola, soy, corn, sunflower and peanut. Check the labels, and avoid partially hydrogenated oil, which have unhealthy trans fats.   If youre thirsty, drink water. Coffee and tea are good in moderation, but skip sugary drinks and limit milk and dairy products to one or two daily servings.   The type of carbohydrate in the diet is more important than the amount. Some sources of carbohydrates, such as vegetables, fruits, whole grains, and beans-are healthier than others.   Finally, stay active  Signed, Thomasene Ripple, DO  05/10/2023 9:19 PM    [facility]

## 2023-05-14 ENCOUNTER — Ambulatory Visit
Admission: EM | Admit: 2023-05-14 | Discharge: 2023-05-14 | Disposition: A | Discharge status: Home / Self Care | Payer: Medicaid Other | Attending: Internal Medicine | Admitting: Internal Medicine

## 2023-05-14 DIAGNOSIS — Z113 Encounter for screening for infections with a predominantly sexual mode of transmission: Secondary | ICD-10-CM | POA: Diagnosis present

## 2023-05-14 DIAGNOSIS — Z202 Contact with and (suspected) exposure to infections with a predominantly sexual mode of transmission: Secondary | ICD-10-CM | POA: Insufficient documentation

## 2023-05-14 MED ORDER — DOXYCYCLINE HYCLATE 100 MG PO CAPS: 100.0000 mg | ORAL_CAPSULE | Freq: Two times a day (BID) | ORAL | 0 refills | Status: AC

## 2023-05-14 NOTE — ED Triage Notes (Signed)
Pt would like to have STD testing done, pt states she was told by a sexual partner they were positive for chlamydia. Pt is not having symptoms at this time.

## 2023-05-14 NOTE — Discharge Instructions (Addendum)
The clinic will contact you with results of the testing done today if positive.  Please start doxycycline twice daily for 7 days given your exposure to chlamydia.  Please follow-up with your PCP as needed.

## 2023-05-14 NOTE — ED Provider Notes (Signed)
[facility]    CSN: [id number] Arrival date & time: 05/14/23  1215      History   Chief Complaint Chief Complaint  Patient presents with   Exposure to STD    HPI Kelsey Lee is a 47 y.o. female presents for STD testing.  Patient reports she was informed today by a partner from 3 weeks ago that they have chlamydia.  Patient currently denies any symptoms including dysuria or vaginal discharge.  No other concerns at this time.   Exposure to STD    Past Medical History:  Diagnosis Date   Anxiety    Depression    Heart murmur    Low vitamin D level    Psoriasis    PTSD (post-traumatic stress disorder)     Patient Active Problem List   Diagnosis Date Noted   Psoriasis    Medication management 10/18/2021   Chest pain of uncertain etiology 10/18/2021   Palpitations 10/18/2021   Obesity (BMI 30-39.9) 10/18/2021    Past Surgical History:  Procedure Laterality Date   CESAREAN SECTION     ENDOMETRIAL ABLATION     TUBAL LIGATION      OB History   No obstetric history on file.      Home Medications    Prior to Admission medications   Medication Sig Start Date End Date Taking? Authorizing Provider  buPROPion (WELLBUTRIN XL) 300 MG 24 hr tablet Take 300 mg by mouth daily. 02/12/23  Yes [provider]  CHANTIX 1 MG tablet Take 1 mg by mouth 2 (two) times daily. 04/17/23  Yes [provider]  Cholecalciferol (VITAMIN D) 125 MCG (5000 UT) CAPS daily.   Yes [provider]  doxycycline (VIBRAMYCIN) 100 MG capsule Take 1 capsule (100 mg total) by mouth 2 (two) times daily for 7 days. 05/14/23 05/21/23 Yes Radford Pax, NP  hydrOXYzine (ATARAX) 25 MG tablet Take 25 mg by mouth as needed.   Yes [provider]  benzonatate (TESSALON) 100 MG capsule Take 1 capsule (100 mg total) by mouth 2 (two) times daily as needed for cough. 07/14/22   Freddy Finner, NP  brompheniramine-pseudoephedrine-DM 30-2-10 MG/5ML syrup Take 5  mLs by mouth 4 (four) times daily as needed. 07/14/22   Freddy Finner, NP  cetirizine (ZYRTEC ALLERGY) 10 MG tablet Take 1 tablet (10 mg total) by mouth daily. 05/25/22   Rising, Lurena Joiner, PA-C  diclofenac (VOLTAREN) 0.1 % ophthalmic solution Place 1 drop into the left eye every 6 (six) hours. 12/29/22   Wallis Bamberg, PA-C  famotidine (PEPCID) 20 MG tablet Take 1 tablet (20 mg total) by mouth at bedtime. 05/25/22   Rising, Rebecca, PA-C  fluticasone (FLONASE) 50 MCG/ACT nasal spray Place 2 sprays into both nostrils daily. 07/14/22   Freddy Finner, NP  ketoconazole (NIZORAL) 2 % cream Apply 1 Application topically daily. 04/06/22   Junie Spencer, FNP  propranolol (INDERAL) 10 MG tablet Take 1 tablet (10 mg total) by mouth daily. 05/07/23   Tobb, Kardie, DO  traZODone (DESYREL) 50 MG tablet Take 50 mg by mouth at bedtime. 05/10/22   [provider]    Family History Family History  Problem Relation Age of Onset   Diabetes Mother    Hypertension Mother    Cancer Father    Diabetes Father    Hypertension Father     Social History Social History   Tobacco Use   Smoking status: Former    Types: Cigarettes  Smokeless tobacco: Never   Tobacco comments:    2 cigs per day  Vaping Use   Vaping status: Never Used  Substance Use Topics   Alcohol use: Not Currently   Drug use: Yes    Types: Marijuana    Comment: occasionally     Allergies   Shrimp (diagnostic)   Review of Systems Review of Systems  Genitourinary:        STD screening/exposure to chlamydia     Physical Exam Triage Vital Signs ED Triage Vitals  Encounter Vitals Group     BP 05/14/23 1301 111/71     Systolic BP Percentile --      Diastolic BP Percentile --      Pulse Rate 05/14/23 1301 69     Resp 05/14/23 1301 16     Temp 05/14/23 1301 98.6 F (37 C)     Temp Source 05/14/23 1301 Oral     SpO2 05/14/23 1301 95 %     Weight --      Height --      Head Circumference --      Peak Flow --       Pain Score 05/14/23 1303 0     Pain Loc --      Pain Education --      Exclude from Growth Chart --    No data found.  Updated Vital Signs BP 111/71 (BP Location: Right Arm)   Pulse 69   Temp 98.6 F (37 C) (Oral)   Resp 16   LMP 05/01/2023 (Approximate)   SpO2 95%   Visual Acuity Right Eye Distance:   Left Eye Distance:   Bilateral Distance:    Right Eye Near:   Left Eye Near:    Bilateral Near:     Physical Exam Vitals and nursing note reviewed.  Constitutional:      Appearance: Normal appearance.  HENT:     Head: Normocephalic and atraumatic.  Eyes:     Pupils: Pupils are equal, round, and reactive to light.  Cardiovascular:     Rate and Rhythm: Normal rate.  Pulmonary:     Effort: Pulmonary effort is normal.  Abdominal:     Tenderness: There is no right CVA tenderness or left CVA tenderness.  Skin:    General: Skin is warm and dry.  Neurological:     General: No focal deficit present.     Mental Status: She is alert and oriented to person, place, and time.  Psychiatric:        Mood and Affect: Mood normal.        Behavior: Behavior normal.      UC Treatments / Results  Labs (all labs ordered are listed, but only abnormal results are displayed) Labs Reviewed  RPR  HIV ANTIBODY (ROUTINE TESTING W REFLEX)  CERVICOVAGINAL ANCILLARY ONLY    EKG   Radiology No results found.  Procedures Procedures (including critical care time)  Medications Ordered in UC Medications - No data to display  Initial Impression / Assessment and Plan / UC Course  I have reviewed the triage vital signs and the nursing notes.  Pertinent labs & imaging results that were available during my care of the patient were reviewed by me and considered in my medical decision making (see chart for details).     STD testing is ordered and will contact for any positive results.  Due to known exposure to chlamydia will start doxycycline twice daily for 7 days.  Patient  to  follow-up with PCP as needed.  Final Clinical Impressions(s) / UC Diagnoses   Final diagnoses:  Exposure to chlamydia  Screening examination for STD (sexually transmitted disease)     Discharge Instructions      The clinic will contact you with results of the testing done today if positive.  Please start doxycycline twice daily for 7 days given your exposure to chlamydia.  Please follow-up with your PCP as needed.   ED Prescriptions     Medication Sig Dispense Auth. Provider   doxycycline (VIBRAMYCIN) 100 MG capsule Take 1 capsule (100 mg total) by mouth 2 (two) times daily for 7 days. 14 capsule Radford Pax, NP      PDMP not reviewed this encounter.   Radford Pax, NP 05/14/23 1323

## 2023-05-15 LAB — HIV ANTIBODY (ROUTINE TESTING W REFLEX): HIV Screen 4th Generation wRfx: NONREACTIVE

## 2023-05-15 LAB — RPR: RPR Ser Ql: NONREACTIVE

## 2023-05-17 LAB — CERVICOVAGINAL ANCILLARY ONLY
Chlamydia: NEGATIVE
Comment: NEGATIVE
Comment: NEGATIVE
Comment: NORMAL
Neisseria Gonorrhea: NEGATIVE
Trichomonas: NEGATIVE

## 2023-05-21 ENCOUNTER — Ambulatory Visit
Admission: RE | Admit: 2023-05-21 | Discharge: 2023-05-21 | Disposition: A | Discharge status: Home / Self Care | Payer: Medicaid Other | Source: Ambulatory Visit

## 2023-05-21 DIAGNOSIS — Z1231 Encounter for screening mammogram for malignant neoplasm of breast: Secondary | ICD-10-CM

## 2023-07-27 ENCOUNTER — Ambulatory Visit
Admission: EM | Admit: 2023-07-27 | Discharge: 2023-07-27 | Disposition: A | Discharge status: Home / Self Care | Payer: Medicaid Other | Attending: Family Medicine | Admitting: Family Medicine

## 2023-07-27 DIAGNOSIS — J40 Bronchitis, not specified as acute or chronic: Secondary | ICD-10-CM | POA: Diagnosis not present

## 2023-07-27 DIAGNOSIS — J329 Chronic sinusitis, unspecified: Secondary | ICD-10-CM | POA: Diagnosis not present

## 2023-07-27 MED ORDER — PROMETHAZINE-DM 6.25-15 MG/5ML PO SYRP: 5.0000 mL | ORAL_SOLUTION | Freq: Three times a day (TID) | ORAL | 0 refills | Status: DC | PRN

## 2023-07-27 MED ORDER — PREDNISONE 20 MG PO TABS: ORAL_TABLET | ORAL | 0 refills | Status: DC

## 2023-07-27 MED ORDER — AMOXICILLIN-POT CLAVULANATE 875-125 MG PO TABS: 1.0000 | ORAL_TABLET | Freq: Two times a day (BID) | ORAL | 0 refills | Status: DC

## 2023-07-27 NOTE — ED Provider Notes (Signed)
[facility]  Note:  This document was prepared using Conservation officer, historic buildings and may include unintentional dictation errors.  MRN: [id number] DOB: [birth year]  Subjective:   Kelsey Lee is a 47 y.o. female presenting for 2-week history of acute onset persistent sinus congestion, sinus drainage, intermittent hoarseness now worsening in the past week with chest congestion, a more productive cough and chest pain.  Has had 1 sick contact with the exact symptoms she has and is being seen today.  No current facility-administered medications for this encounter.  Current Outpatient Medications:    benzonatate (TESSALON) 100 MG capsule, Take 1 capsule (100 mg total) by mouth 2 (two) times daily as needed for cough., Disp: 20 capsule, Rfl: 0   brompheniramine-pseudoephedrine-DM 30-2-10 MG/5ML syrup, Take 5 mLs by mouth 4 (four) times daily as needed., Disp: 120 mL, Rfl: 0   buPROPion (WELLBUTRIN XL) 300 MG 24 hr tablet, Take 300 mg by mouth daily., Disp: , Rfl:    cetirizine (ZYRTEC ALLERGY) 10 MG tablet, Take 1 tablet (10 mg total) by mouth daily., Disp: 30 tablet, Rfl: 2   CHANTIX 1 MG tablet, Take 1 mg by mouth 2 (two) times daily., Disp: , Rfl:    Cholecalciferol (VITAMIN D) 125 MCG (5000 UT) CAPS, daily., Disp: , Rfl:    diclofenac (VOLTAREN) 0.1 % ophthalmic solution, Place 1 drop into the left eye every 6 (six) hours., Disp: 5 mL, Rfl: 0   famotidine (PEPCID) 20 MG tablet, Take 1 tablet (20 mg total) by mouth at bedtime., Disp: 30 tablet, Rfl: 0   fluticasone (FLONASE) 50 MCG/ACT nasal spray, Place 2 sprays into both nostrils daily., Disp: 16 g, Rfl: 0   hydrOXYzine (ATARAX) 25 MG tablet, Take 25 mg by mouth as needed., Disp: , Rfl:    ketoconazole (NIZORAL) 2 % cream, Apply 1 Application topically daily., Disp: 60 g, Rfl: 1   propranolol (INDERAL) 10 MG tablet, Take 1 tablet (10 mg total) by mouth daily., Disp: 90 tablet, Rfl: 3   traZODone  (DESYREL) 50 MG tablet, Take 50 mg by mouth at bedtime., Disp: , Rfl:    Allergies  Allergen Reactions   Shrimp (Diagnostic) Itching and Swelling    Past Medical History:  Diagnosis Date   Anxiety    Depression    Heart murmur    Low vitamin D level    Psoriasis    PTSD (post-traumatic stress disorder)      Past Surgical History:  Procedure Laterality Date   CESAREAN SECTION     ENDOMETRIAL ABLATION     TUBAL LIGATION      Family History  Problem Relation Age of Onset   Diabetes Mother    Hypertension Mother    Cancer Father    Diabetes Father    Hypertension Father     Social History   Tobacco Use   Smoking status: Former    Types: Cigarettes   Smokeless tobacco: Never   Tobacco comments:    2 cigs per day  Vaping Use   Vaping status: Never Used  Substance Use Topics   Alcohol use: Not Currently   Drug use: Not Currently    ROS   Objective:   Vitals: BP 124/79 (BP Location: Left Arm)   Pulse 90   Temp 98.2 F (36.8 C) (Oral)   Resp 16   SpO2 97%   Physical Exam Constitutional:      General: She is not in acute distress.  Appearance: Normal appearance. She is well-developed and normal weight. She is not ill-appearing, toxic-appearing or diaphoretic.  HENT:     Head: Normocephalic and atraumatic.     Right Ear: Tympanic membrane, ear canal and external ear normal. No drainage or tenderness. No middle ear effusion. There is no impacted cerumen. Tympanic membrane is not erythematous or bulging.     Left Ear: Tympanic membrane, ear canal and external ear normal. No drainage or tenderness.  No middle ear effusion. There is no impacted cerumen. Tympanic membrane is not erythematous or bulging.     Nose: Congestion present. No rhinorrhea.     Mouth/Throat:     Mouth: Mucous membranes are moist. No oral lesions.     Pharynx: No pharyngeal swelling, oropharyngeal exudate, posterior oropharyngeal erythema or uvula swelling.     Tonsils: No tonsillar  exudate or tonsillar abscesses.     Comments: Hoarseness of voice noted. Eyes:     General: No scleral icterus.       Right eye: No discharge.        Left eye: No discharge.     Extraocular Movements: Extraocular movements intact.     Right eye: Normal extraocular motion.     Left eye: Normal extraocular motion.     Conjunctiva/sclera: Conjunctivae normal.  Cardiovascular:     Rate and Rhythm: Normal rate and regular rhythm.     Heart sounds: Normal heart sounds. No murmur heard.    No friction rub. No gallop.  Pulmonary:     Effort: Pulmonary effort is normal. No respiratory distress.     Breath sounds: No stridor. Rhonchi present. No wheezing or rales.  Chest:     Chest wall: No tenderness.  Musculoskeletal:     Cervical back: Normal range of motion and neck supple.  Lymphadenopathy:     Cervical: No cervical adenopathy.  Skin:    General: Skin is warm and dry.  Neurological:     General: No focal deficit present.     Mental Status: She is alert and oriented to person, place, and time.  Psychiatric:        Mood and Affect: Mood normal.        Behavior: Behavior normal.     Assessment and Plan :   PDMP not reviewed this encounter.  1. Sinobronchitis    Recommended managing for sinobronchitis with Augmentin, prednisone and cough syrup.  Will defer imaging for now as I am treating empirically based off of physical exam findings, progression of illness.  Use supportive care otherwise. Counseled patient on potential for adverse effects with medications prescribed/recommended today, ER and return-to-clinic precautions discussed, patient verbalized understanding.    Wallis Bamberg, [place] 07/30/23 [phone]

## 2023-07-27 NOTE — ED Triage Notes (Signed)
Pt c/o head/chest congestion x 2 week sago-felt better then c/o chest tightness/chest congestion, prod cough, n/v x 5 days-NAD-steady gait

## 2023-07-27 NOTE — Discharge Instructions (Addendum)
Start Augmentin and prednisone to help with the sinobronchitis infection. Use cough syrup as needed. Use Zyrtec, Tylenol as needed.

## 2023-08-16 ENCOUNTER — Telehealth: Payer: Medicaid Other | Admitting: Family Medicine

## 2023-08-16 DIAGNOSIS — B379 Candidiasis, unspecified: Secondary | ICD-10-CM | POA: Diagnosis not present

## 2023-08-16 DIAGNOSIS — T3695XA Adverse effect of unspecified systemic antibiotic, initial encounter: Secondary | ICD-10-CM | POA: Diagnosis not present

## 2023-08-16 MED ORDER — FLUCONAZOLE 150 MG PO TABS: 150.0000 mg | ORAL_TABLET | ORAL | 0 refills | Status: DC

## 2023-08-16 NOTE — Progress Notes (Signed)

## 2023-09-20 ENCOUNTER — Telehealth: Payer: Medicaid Other | Admitting: Physician Assistant

## 2023-09-20 DIAGNOSIS — B9689 Other specified bacterial agents as the cause of diseases classified elsewhere: Secondary | ICD-10-CM | POA: Diagnosis not present

## 2023-09-20 DIAGNOSIS — J208 Acute bronchitis due to other specified organisms: Secondary | ICD-10-CM

## 2023-09-20 MED ORDER — ALBUTEROL SULFATE HFA 108 (90 BASE) MCG/ACT IN AERS: 1.0000 | INHALATION_SPRAY | Freq: Four times a day (QID) | RESPIRATORY_TRACT | 0 refills | Status: DC | PRN

## 2023-09-20 MED ORDER — BENZONATATE 100 MG PO CAPS: 100.0000 mg | ORAL_CAPSULE | Freq: Three times a day (TID) | ORAL | 0 refills | Status: DC | PRN

## 2023-09-20 MED ORDER — AZITHROMYCIN 250 MG PO TABS: ORAL_TABLET | ORAL | 0 refills | Status: AC

## 2023-09-20 NOTE — Progress Notes (Signed)
 E-Visit for Cough   We are sorry that you are not feeling well.  Here is how we plan to help!  Based on your presentation I believe you most likely have A cough due to bacteria.  When patients have a fever and a productive cough with a change in color or increased sputum production, we are concerned about bacterial bronchitis.  If left untreated it can progress to pneumonia.  If your symptoms do not improve with your treatment plan it is important that you contact your provider.   I have prescribed Azithromyin 250 mg: two tablets now and then one tablet daily for 4 additonal days    In addition you may use A non-prescription cough medication called Mucinex DM: take 2 tablets every 12 hours. and A prescription cough medication called Tessalon Perles 100mg . You may take 1-2 capsules every 8 hours as needed for your cough.  I have also prescribed Albuterol inhaler Use 1-2 puffs every 6 hours as needed for shortness of breath, chest tightness, and/or wheezing.  From your responses in the eVisit questionnaire you describe inflammation in the upper respiratory tract which is causing a significant cough.  This is commonly called Bronchitis and has four common causes:   Allergies Viral Infections Acid Reflux Bacterial Infection Allergies, viruses and acid reflux are treated by controlling symptoms or eliminating the cause. An example might be a cough caused by taking certain blood pressure medications. You stop the cough by changing the medication. Another example might be a cough caused by acid reflux. Controlling the reflux helps control the cough.  USE OF BRONCHODILATOR ("RESCUE") INHALERS: There is a risk from using your bronchodilator too frequently.  The risk is that over-reliance on a medication which only relaxes the muscles surrounding the breathing tubes can reduce the effectiveness of medications prescribed to reduce swelling and congestion of the tubes themselves.  Although you feel brief  relief from the bronchodilator inhaler, your asthma may actually be worsening with the tubes becoming more swollen and filled with mucus.  This can delay other crucial treatments, such as oral steroid medications. If you need to use a bronchodilator inhaler daily, several times per day, you should discuss this with your provider.  There are probably better treatments that could be used to keep your asthma under control.     HOME CARE Only take medications as instructed by your medical team. Complete the entire course of an antibiotic. Drink plenty of fluids and get plenty of rest. Avoid close contacts especially the very young and the elderly Cover your mouth if you cough or cough into your sleeve. Always remember to wash your hands A steam or ultrasonic humidifier can help congestion.   GET HELP RIGHT AWAY IF: You develop worsening fever. You become short of breath You cough up blood. Your symptoms persist after you have completed your treatment plan MAKE SURE YOU  Understand these instructions. Will watch your condition. Will get help right away if you are not doing well or get worse.    Thank you for choosing an e-visit.  Your e-visit answers were reviewed by a board certified advanced clinical practitioner to complete your personal care plan. Depending upon the condition, your plan could have included both over the counter or prescription medications.  Please review your pharmacy choice. Make sure the pharmacy is open so you can pick up prescription now. If there is a problem, you may contact your provider through MyChart messaging and have the prescription routed to another  pharmacy.  Your safety is important to [place]. If you have drug allergies check your prescription carefully.   For the next 24 hours you can use MyChart to ask questions about today's visit, request a non-urgent call back, or ask for a work or school excuse. You will get an email in the next two days asking about  your experience. I hope that your e-visit has been valuable and will speed your recovery.   I have spent 5 minutes in review of e-visit questionnaire, review and updating patient chart, medical decision making and response to patient.   Margaretann Loveless, PA-C

## 2023-11-24 ENCOUNTER — Telehealth: Admitting: Physician Assistant

## 2023-11-24 DIAGNOSIS — L03314 Cellulitis of groin: Secondary | ICD-10-CM

## 2023-11-25 MED ORDER — CEPHALEXIN 500 MG PO CAPS: 500.0000 mg | ORAL_CAPSULE | Freq: Four times a day (QID) | ORAL | 0 refills | Status: DC

## 2023-11-25 NOTE — Progress Notes (Signed)
 E Visit for Cellulitis  We are sorry that you are not feeling well. Here is how we plan to help!  Based on what you shared with me it looks like you have cellulitis.  Cellulitis looks like areas of skin redness, swelling, and warmth; it develops as a result of bacteria entering under the skin. Little red spots and/or bleeding can be seen in skin, and tiny surface sacs containing fluid can occur. Fever can be present. Cellulitis is almost always on one side of a body, and the lower limbs are the most common site of involvement.   I have prescribed:  Keflex 500mg  take one by mouth four times a day for 5 days.  HOME CARE:  . Take your medications as ordered and take all of them, even if the skin irritation appears to be healing.   GET HELP RIGHT AWAY IF:  . Symptoms that don't begin to go away within 48 hours. . Severe redness persists or worsens . If the area turns color, spreads or swells. . If it blisters and opens, develops yellow-brown crust or bleeds. . You develop a fever or chills. . If the pain increases or becomes unbearable.  . Are unable to keep fluids and food down.  MAKE SURE YOU    Understand these instructions.  Will watch your condition.  Will get help right away if you are not doing well or get worse.  Thank you for choosing an e-visit. Your e-visit answers were reviewed by a board certified advanced clinical practitioner to complete your personal care plan. Depending upon the condition, your plan could have included both over the counter or prescription medications. Please review your pharmacy choice. Make sure the pharmacy is open so you can pick up prescription now. If there is a problem, you may contact your provider through [employer] and have the prescription routed to another pharmacy. Your safety is important to [place]. If you have drug allergies check your prescription carefully.  For the next 24 hours you can use MyChart to ask questions about today's  visit, request a non-urgent call back, or ask for a work or school excuse. You will get an email in the next two days asking about your experience. I hope that your e-visit has been valuable and will speed your recovery.

## 2023-11-25 NOTE — Progress Notes (Signed)
 I have spent 5 minutes in review of e-visit questionnaire, review and updating patient chart, medical decision making and response to patient.   Piedad Climes, PA-C

## 2024-04-21 ENCOUNTER — Encounter: Payer: Self-pay | Admitting: Cardiology

## 2024-06-07 ENCOUNTER — Telehealth: Admitting: Physician Assistant

## 2024-06-07 DIAGNOSIS — J029 Acute pharyngitis, unspecified: Secondary | ICD-10-CM

## 2024-06-07 MED ORDER — PSEUDOEPH-BROMPHEN-DM 30-2-10 MG/5ML PO SYRP: 5.0000 mL | ORAL_SOLUTION | Freq: Four times a day (QID) | ORAL | 0 refills | Status: AC | PRN

## 2024-06-07 MED ORDER — LIDOCAINE VISCOUS HCL 2 % MT SOLN: 5.0000 mL | Freq: Four times a day (QID) | OROMUCOSAL | 0 refills | Status: AC | PRN

## 2024-06-07 NOTE — Progress Notes (Signed)
 We are sorry that you are not feeling well.  Here is how we plan to help!  Your symptoms indicate a likely viral infection (Pharyngitis).   Pharyngitis is inflammation in the back of the throat which can cause a sore throat, scratchiness and sometimes difficulty swallowing.   Pharyngitis is typically caused by a respiratory virus and will just run its course.  Please keep in mind that your symptoms could last up to 10 days.    For throat pain, we recommend over the counter oral pain relief medications such as acetaminophen or aspirin, or anti-inflammatory medications such as ibuprofen or naproxen  sodium.  Topical treatments such as oral throat lozenges or sprays may be used as needed. For throat pain, I have prescribed a Viscous Lidocaine 2% solution. Swallow 5-10 mL every 4-6 hours as needed for sore throat. DO NOT eat or drink anything for 15-20 minutes after swallowing to allow the medication to coat the throat. I have also prescribed Bromfed DM cough syrup Take 5mL every 6 hours as needed for cough and congestion.   Avoid close contact with loved ones, especially the very young and elderly.  Remember to wash your hands thoroughly throughout the day as this is the number one way to prevent the spread of infection! We also recommend that you periodically wipe down door knobs and counters with disinfectant.  After careful review of your answers, I would not recommend and antibiotic for your condition.  Antibiotics should not be used to treat conditions that we suspect are caused by viruses like the virus that causes the common cold or flu.  Providers prescribe antibiotics to treat infections caused by bacteria. Antibiotics are very powerful in treating bacterial infections when they are used properly.  To maintain their effectiveness, they should be used only when necessary.  Overuse of antibiotics has resulted in the development of super bugs that are resistant to treatment!    Some people with strep  throat, however, can have atypical symptoms. As such, if anything continued to progress despite treatment recommendations, you may need formal testing in clinic or office.  A work note has been provided for you today. It will be available under letters in your MyChart account.   Home Care: Only take medications as instructed by your medical team. Do not drink alcohol while taking these medications. A steam or ultrasonic humidifier can help congestion.  You can place a towel over your head and breathe in the steam from hot water coming from a faucet. Avoid close contacts especially the very young and the elderly. Cover your mouth when you cough or sneeze. Always remember to wash your hands.  Get Help Right Away If: You develop worsening fever or throat pain. You develop a severe head ache or visual changes. Your symptoms persist after you have completed your treatment plan.  Make sure you Understand these instructions. Will watch your condition. Will get help right away if you are not doing well or get worse.  Your e-visit answers were reviewed by a board certified advanced clinical practitioner to complete your personal care plan.  Depending on the condition, your plan could have included both over the counter or prescription medications.  If there is a problem please reply once you have received a response from your provider.  Your safety is important to us .  If you have drug allergies check your prescription carefully.    You can use MyChart to ask questions about todays visit, request a non-urgent call back, or  ask for a work or school excuse for 24 hours related to this e-Visit. If it has been greater than 24 hours you will need to follow up with your provider, or enter a new e-Visit to address those concerns.  You will get an e-mail in the next two days asking about your experience.  I hope that your e-visit has been valuable and will speed your recovery. Thank you for using  e-visits.   I have spent 5 minutes in review of e-visit questionnaire, review and updating patient chart, medical decision making and response to patient.   Delon CHRISTELLA Dickinson, PA-C
